# Patient Record
Sex: Female | Born: 1985 | Race: White | Hispanic: No | Marital: Married | Smoking: Never smoker
Health system: Southern US, Community
[De-identification: ages and names within clinical notes are randomized; demographics above are authoritative.]

## PROBLEM LIST (undated history)

## (undated) DIAGNOSIS — K649 Unspecified hemorrhoids: Secondary | ICD-10-CM

## (undated) DIAGNOSIS — B009 Herpesviral infection, unspecified: Secondary | ICD-10-CM

## (undated) DIAGNOSIS — O24419 Gestational diabetes mellitus in pregnancy, unspecified control: Secondary | ICD-10-CM

## (undated) DIAGNOSIS — F909 Attention-deficit hyperactivity disorder, unspecified type: Secondary | ICD-10-CM

## (undated) HISTORY — DX: Attention-deficit hyperactivity disorder, unspecified type: F90.9

---

## 2018-09-11 ENCOUNTER — Encounter: Payer: Self-pay | Admitting: Family Medicine

## 2018-09-27 ENCOUNTER — Encounter: Payer: Self-pay | Admitting: Family Medicine

## 2018-10-31 ENCOUNTER — Encounter: Payer: Self-pay | Admitting: Obstetrics & Gynecology

## 2018-11-11 ENCOUNTER — Ambulatory Visit (INDEPENDENT_AMBULATORY_CARE_PROVIDER_SITE_OTHER): Payer: 59 | Admitting: Advanced Practice Midwife

## 2018-11-11 ENCOUNTER — Other Ambulatory Visit: Payer: Self-pay

## 2018-11-11 ENCOUNTER — Encounter: Payer: Self-pay | Admitting: Advanced Practice Midwife

## 2018-11-11 VITALS — BP 122/82 | HR 98 | Ht 67.0 in | Wt 163.3 lb

## 2018-11-11 DIAGNOSIS — Z124 Encounter for screening for malignant neoplasm of cervix: Secondary | ICD-10-CM

## 2018-11-11 DIAGNOSIS — O219 Vomiting of pregnancy, unspecified: Secondary | ICD-10-CM

## 2018-11-11 DIAGNOSIS — Z113 Encounter for screening for infections with a predominantly sexual mode of transmission: Secondary | ICD-10-CM

## 2018-11-11 DIAGNOSIS — N898 Other specified noninflammatory disorders of vagina: Secondary | ICD-10-CM

## 2018-11-11 DIAGNOSIS — K5900 Constipation, unspecified: Secondary | ICD-10-CM

## 2018-11-11 DIAGNOSIS — Z3A19 19 weeks gestation of pregnancy: Secondary | ICD-10-CM

## 2018-11-11 DIAGNOSIS — Z3401 Encounter for supervision of normal first pregnancy, first trimester: Secondary | ICD-10-CM

## 2018-11-11 DIAGNOSIS — O99611 Diseases of the digestive system complicating pregnancy, first trimester: Secondary | ICD-10-CM

## 2018-11-11 MED ORDER — DOCUSATE SODIUM 100 MG PO CAPS: 100.0000 mg | ORAL_CAPSULE | Freq: Two times a day (BID) | ORAL | 2 refills | Status: DC | PRN

## 2018-11-11 MED ORDER — POLYETHYLENE GLYCOL 3350 17 GM/SCOOP PO POWD: 17.0000 g | Freq: Every day | ORAL | 0 refills | Status: DC

## 2018-11-11 MED ORDER — METOCLOPRAMIDE HCL 10 MG PO TABS: 10.0000 mg | ORAL_TABLET | Freq: Four times a day (QID) | ORAL | 2 refills | Status: DC | PRN

## 2018-11-11 NOTE — Patient Instructions (Signed)

## 2018-11-11 NOTE — Progress Notes (Signed)
Pt is here for initial OB visit. LMP 08/21/18. EDD 05/28/19

## 2018-11-11 NOTE — Progress Notes (Signed)
Subjective:   Azucena Fallennna Nevills is a 33 y.o. G1P0 at 3071w5d by LMP being seen today for her first obstetrical visit.  Her obstetrical history is significant for none and has Encounter for supervision of normal first pregnancy in first trimester on their problem list.. Patient does intend to breast feed. Pregnancy history fully reviewed.  Patient reports nausea.  HISTORY: OB History  Gravida Para Term Preterm AB Living  1 0 0 0 0 0  SAB TAB Ectopic Multiple Live Births  0 0 0 0 0    # Outcome Date GA Lbr Len/2nd Weight Sex Delivery Anes PTL Lv  1 Current            Past Medical History:  Diagnosis Date  . ADHD    History reviewed. No pertinent surgical history. Family History  Problem Relation Age of Onset  . Diabetes Mother   . Hypertension Mother   . Hypertension Father   . COPD Maternal Grandmother   . Diabetes Maternal Grandfather   . COPD Maternal Grandfather    Social History   Tobacco Use  . Smoking status: Never Smoker  . Smokeless tobacco: Never Used  Substance Use Topics  . Alcohol use: Not Currently  . Drug use: Yes   Not on File Current Outpatient Medications on File Prior to Visit  Medication Sig Dispense Refill  . Prenatal Vit-Fe Fumarate-FA (PRENATAL VITAMINS PO) Take by mouth.     No current facility-administered medications on file prior to visit.      Exam   Vitals:   11/11/18 1322 11/11/18 1323  BP: 122/82   Pulse: 98   Weight: 74.1 kg   Height:  5\' 7"  (1.702 m)   Fetal Heart Rate (bpm): 165  Uterus:     Pelvic Exam: Perineum: no hemorrhoids, normal perineum   Vulva: normal external genitalia, no lesions   Vagina:  normal mucosa, normal discharge   Cervix: no lesions and normal, pap smear done.    Adnexa: normal adnexa and no mass, fullness, tenderness   Bony Pelvis: average  System: General: well-developed, well-nourished female in no acute distress   Breast:  normal appearance, no masses or tenderness   Skin: normal  coloration and turgor, no rashes   Neurologic: oriented, normal, negative, normal mood   Extremities: normal strength, tone, and muscle mass, ROM of all joints is normal   HEENT PERRLA, extraocular movement intact and sclera clear, anicteric   Mouth/Teeth mucous membranes moist, pharynx normal without lesions and dental hygiene good   Neck supple and no masses   Cardiovascular: regular rate and rhythm   Respiratory:  no respiratory distress, normal breath sounds   Abdomen: soft, non-tender; bowel sounds normal; no masses,  no organomegaly     Assessment:   Pregnancy: G1P0 Patient Active Problem List   Diagnosis Date Noted  . Encounter for supervision of normal first pregnancy in first trimester 11/11/2018     Plan:  1. Encounter for supervision of normal first pregnancy in first trimester --Anticipatory guidance about next visits/weeks of pregnancy given. --Reviewed safety, visitor policy, reassurance about COVID-19 for pregnancy at this time. Discussed possible changes to visits, including televisits, that may occur due to COVID-19.  The office remains open if pt needs to be seen and [facility] is open 24 hours/day for OB emergencies.   - Obstetric Panel, Including HIV - Culture, OB Urine - Genetic Screening - Cervicovaginal ancillary only( [place]) - Cytology - PAP( [place]) - Babyscripts  Schedule Optimization  2. Nausea and vomiting during pregnancy prior to [redacted] weeks gestation --Discussed dietary changes for nausea.  Pt to spot treat with Reglan since n/v are intermittent, not daily. - metoCLOPramide (REGLAN) 10 MG tablet; Take 1 tablet (10 mg total) by mouth every 6 (six) hours as needed for nausea.  Dispense: 30 tablet; Refill: 2  3. Constipation during pregnancy in first trimester --Pt with hx constipation prior to pregnancy, is eating fiber, drinking more fluid but still having symptoms. - docusate sodium (COLACE) 100 MG capsule; Take 1 capsule (100 mg total) by mouth 2  (two) times daily as needed.  Dispense: 30 capsule; Refill: 2 - polyethylene glycol powder (GLYCOLAX/MIRALAX) 17 GM/SCOOP powder; Take 17 g by mouth daily.  Dispense: 255 g; Refill: 0   Initial labs drawn. Continue prenatal vitamins. Discussed and offered genetic screening options, including Quad screen/AFP, NIPS testing, and option to decline testing. Benefits/risks/alternatives reviewed. Pt aware that anatomy US is form of genetic screening with lower accuracy in detecting trisomies than blood work.  Pt chooses genetic screening today. NIPS: ordered. Ultrasound discussed; fetal anatomic survey: ordered. Problem list reviewed and updated. The nature of [facility] with multiple MDs and other Advanced Practice Providers was explained to patient; also emphasized that residents, students are part of our team. Routine obstetric precautions reviewed. No follow-ups on file.   Sharen Counter, CNM 11/11/18 1:51 PM

## 2018-11-12 LAB — CERVICOVAGINAL ANCILLARY ONLY
Bacterial vaginitis: NEGATIVE
Candida vaginitis: NEGATIVE
Chlamydia: NEGATIVE
Neisseria Gonorrhea: NEGATIVE
Trichomonas: NEGATIVE

## 2018-11-12 LAB — OBSTETRIC PANEL, INCLUDING HIV
Antibody Screen: NEGATIVE
Basophils Absolute: 0 10*3/uL (ref 0.0–0.2)
Basos: 0 %
EOS (ABSOLUTE): 0.1 10*3/uL (ref 0.0–0.4)
Eos: 1 %
HIV Screen 4th Generation wRfx: NONREACTIVE
Hematocrit: 41.1 % (ref 34.0–46.6)
Hemoglobin: 14.2 g/dL (ref 11.1–15.9)
Hepatitis B Surface Ag: NEGATIVE
Immature Grans (Abs): 0 10*3/uL (ref 0.0–0.1)
Immature Granulocytes: 0 %
Lymphocytes Absolute: 1.7 10*3/uL (ref 0.7–3.1)
Lymphs: 17 %
MCH: 32.2 pg (ref 26.6–33.0)
MCHC: 34.5 g/dL (ref 31.5–35.7)
MCV: 93 fL (ref 79–97)
Monocytes Absolute: 0.6 10*3/uL (ref 0.1–0.9)
Monocytes: 6 %
Neutrophils Absolute: 7.5 10*3/uL — ABNORMAL HIGH (ref 1.4–7.0)
Neutrophils: 76 %
Platelets: 297 10*3/uL (ref 150–450)
RBC: 4.41 x10E6/uL (ref 3.77–5.28)
RDW: 13 % (ref 11.7–15.4)
RPR Ser Ql: NONREACTIVE
Rh Factor: POSITIVE
Rubella Antibodies, IGG: 2.88 index (ref >0.99)
WBC: 9.9 10*3/uL (ref 3.4–10.8)

## 2018-11-13 LAB — CULTURE, OB URINE

## 2018-11-13 LAB — CYTOLOGY - PAP
Diagnosis: NEGATIVE
HPV: NOT DETECTED

## 2018-11-13 LAB — URINE CULTURE, OB REFLEX: Organism ID, Bacteria: NO GROWTH

## 2018-11-18 ENCOUNTER — Encounter: Payer: Self-pay | Admitting: Advanced Practice Midwife

## 2018-11-19 ENCOUNTER — Encounter: Payer: Self-pay | Admitting: Advanced Practice Midwife

## 2018-11-20 ENCOUNTER — Encounter: Payer: Self-pay | Admitting: Advanced Practice Midwife

## 2018-11-21 ENCOUNTER — Other Ambulatory Visit: Payer: Self-pay

## 2018-11-21 ENCOUNTER — Other Ambulatory Visit: Payer: 59

## 2018-11-21 DIAGNOSIS — Z3401 Encounter for supervision of normal first pregnancy, first trimester: Secondary | ICD-10-CM

## 2018-11-22 LAB — HEMOGLOBIN A1C
Est. average glucose Bld gHb Est-mCnc: 97 mg/dL
Hgb A1c MFr Bld: 5 % (ref 4.8–5.6)

## 2019-01-01 ENCOUNTER — Ambulatory Visit (HOSPITAL_COMMUNITY)
Admission: RE | Admit: 2019-01-01 | Discharge: 2019-01-01 | Disposition: A | Discharge status: Home / Self Care | Payer: 59 | Source: Ambulatory Visit | Attending: Obstetrics and Gynecology | Admitting: Obstetrics and Gynecology

## 2019-01-01 ENCOUNTER — Other Ambulatory Visit: Payer: Self-pay

## 2019-01-01 DIAGNOSIS — Z363 Encounter for antenatal screening for malformations: Secondary | ICD-10-CM | POA: Diagnosis not present

## 2019-01-01 DIAGNOSIS — Z3401 Encounter for supervision of normal first pregnancy, first trimester: Secondary | ICD-10-CM | POA: Insufficient documentation

## 2019-01-01 DIAGNOSIS — Z3A19 19 weeks gestation of pregnancy: Secondary | ICD-10-CM

## 2019-01-01 IMAGING — US US MFM OB COMP + 14 WK
1 series · 13 of 28 positions shown · non-contrast
Comparison: none

[Series 1: us mfm ob comp + 14 wk · 109 acquisitions, 13 frames shown]
[im 5/109]
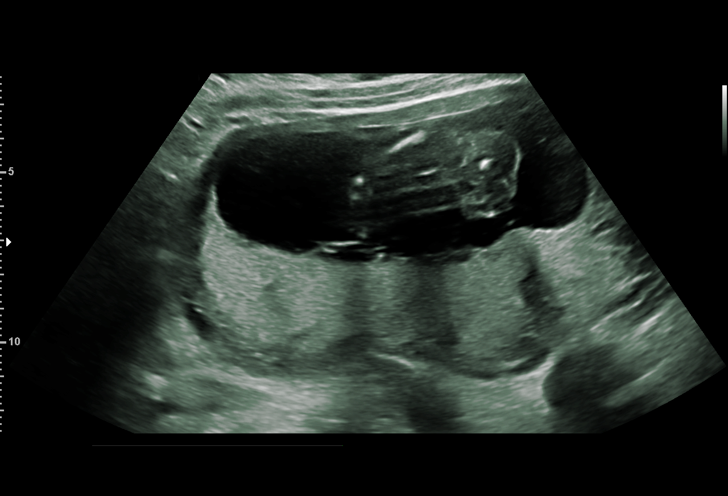
[im 13/109]
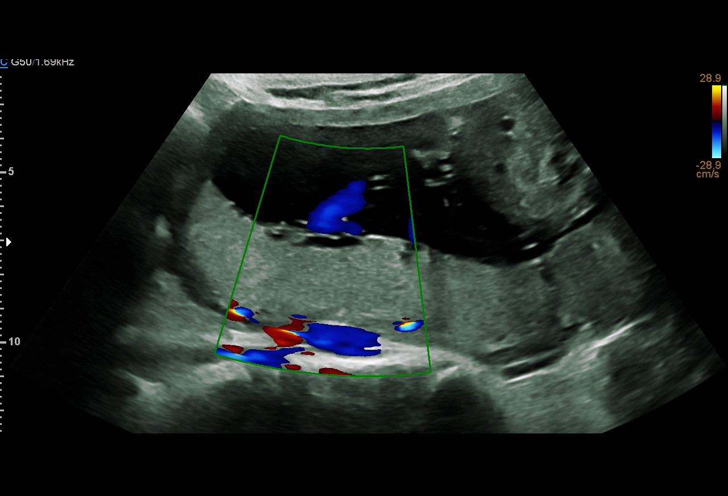
[im 21/109]
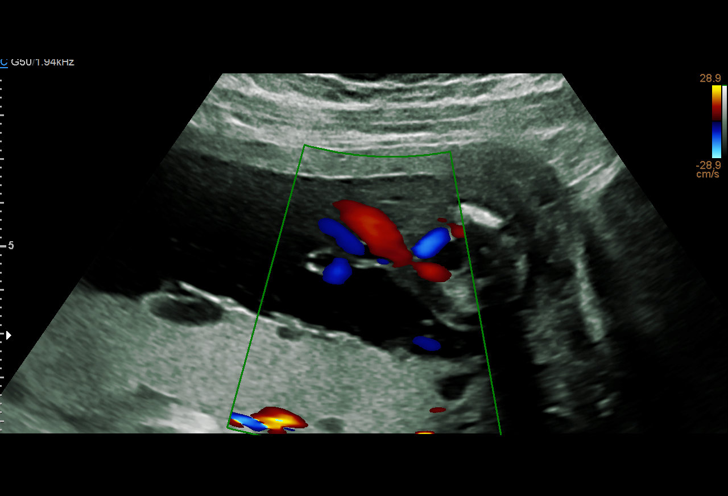
[im 29/109]
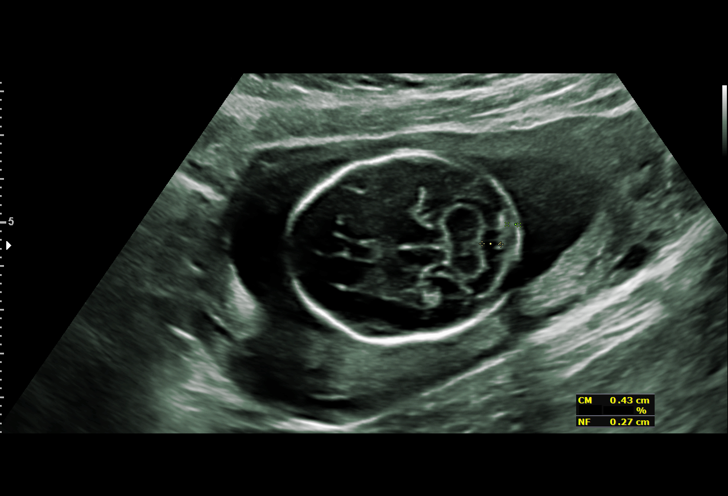
[im 37/109]
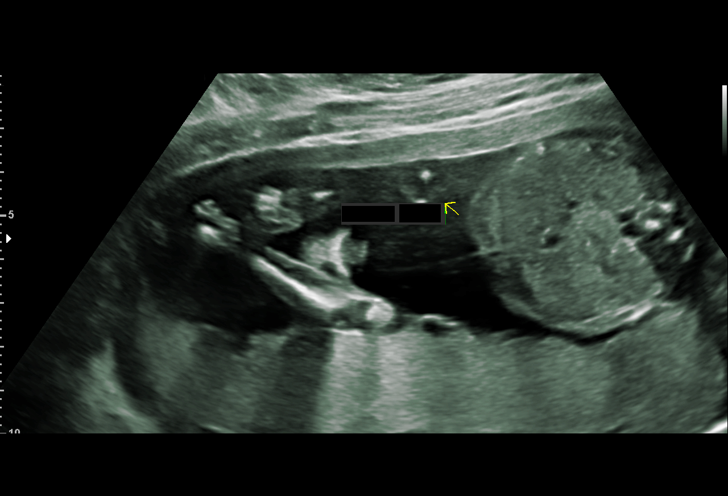
[im 45/109]
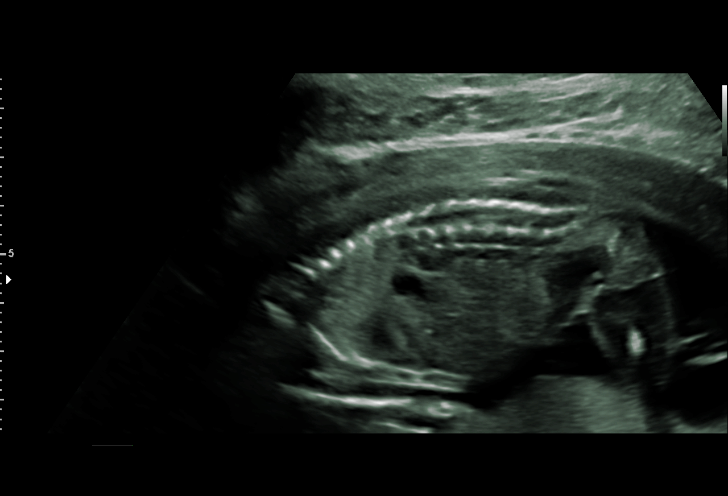
[im 57/109]
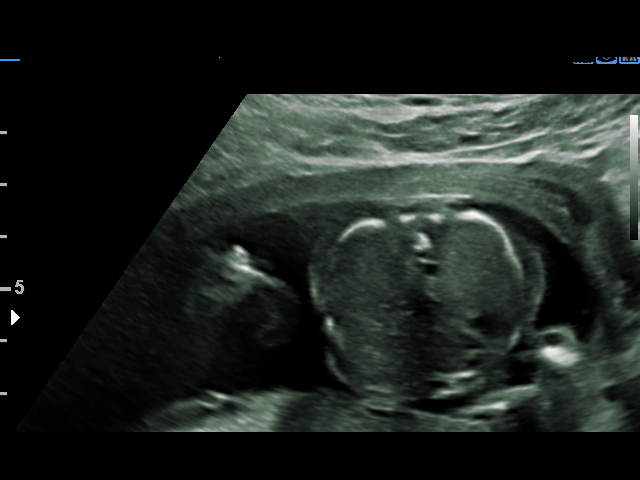
[im 65/109]
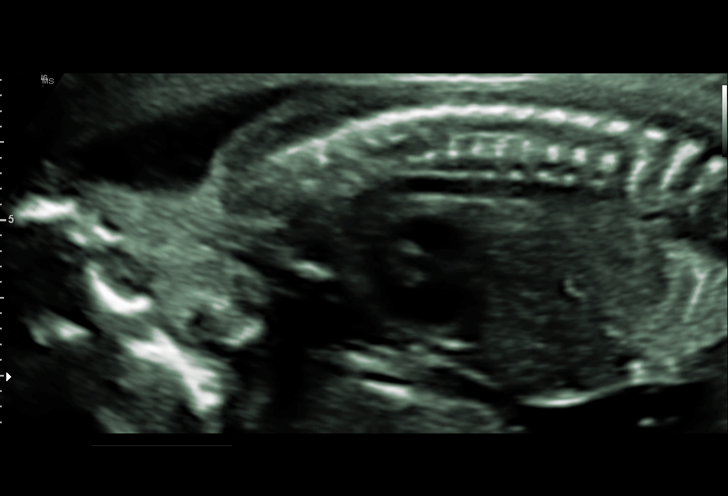
[im 73/109]
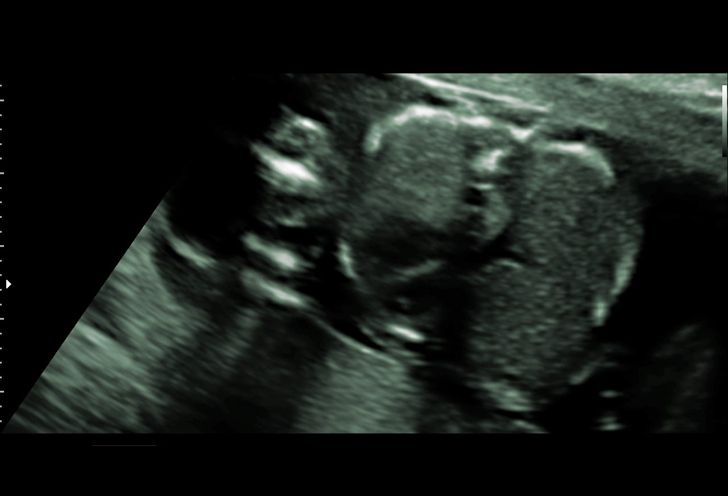
[im 81/109]
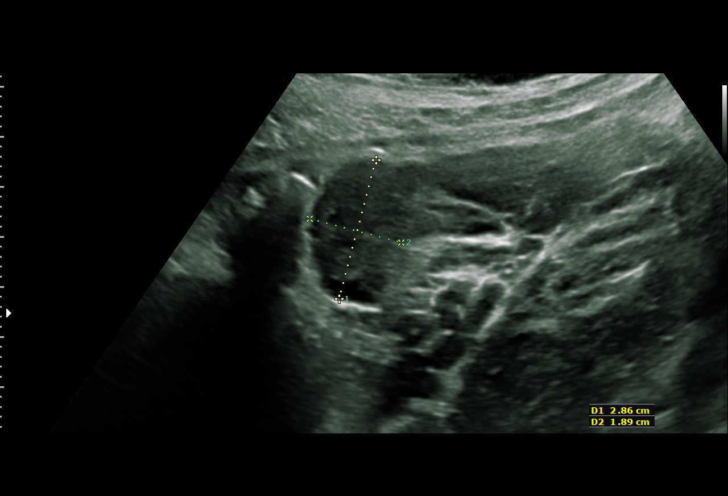
[im 89/109]
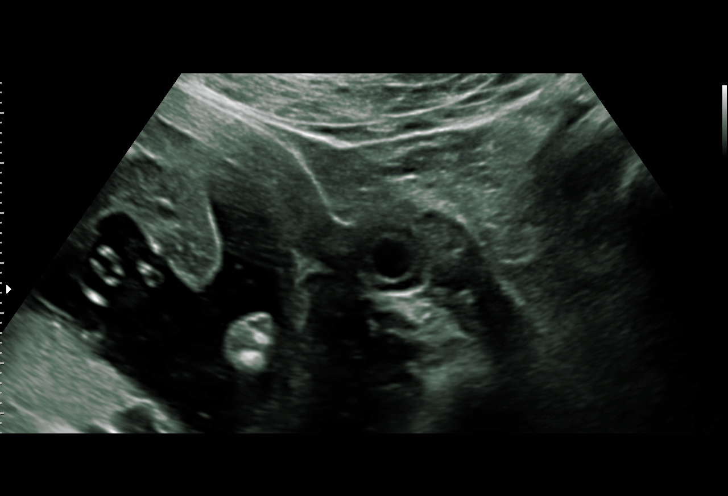
[im 97/109]
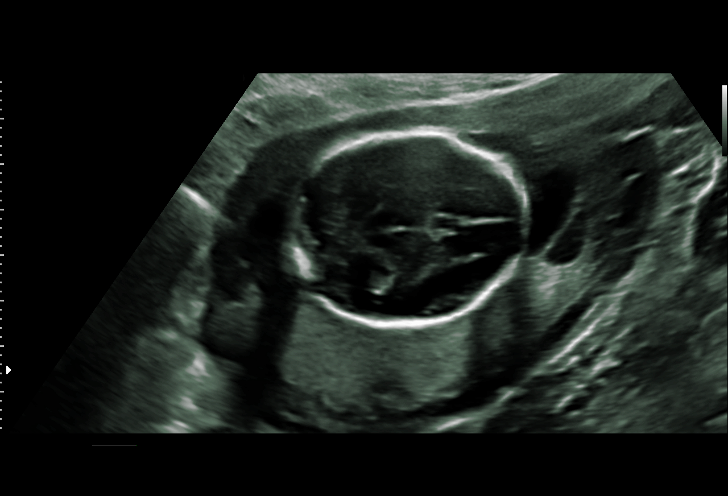
[im 105/109]
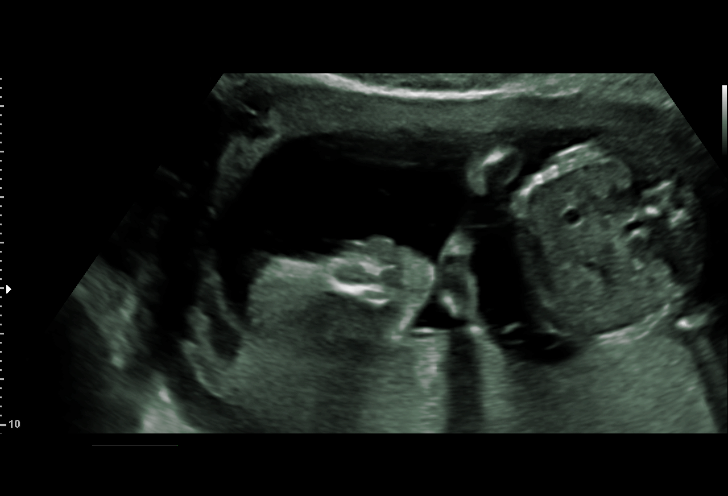

[13 of 28 positions shown; findings below may reference images not displayed]

----------------------------------------------------------------------

 ----------------------------------------------------------------------
Indications

  19 weeks gestation of pregnancy
  Encounter for antenatal screening for
  malformations
  Low risk NIPS
 ----------------------------------------------------------------------
Fetal Evaluation

 Num Of Fetuses:          1
 Fetal Heart Rate(bpm):   146
 Cardiac Activity:        Observed
 Presentation:            Transverse, head to maternal right
 Placenta:                Posterior
 P. Cord Insertion:       Visualized

 Amniotic Fluid
 AFI FV:      Within normal limits

                             Largest Pocket(cm)

Biometry

 BPD:      42.9  mm     G. Age:  19w 0d         50  %    CI:        71.69   %    70 - 86
                                                         FL/HC:       18.0  %    16.1 -
 HC:      161.3  mm     G. Age:  19w 0d         40  %    HC/AC:       1.17       1.09 -
 AC:      137.5  mm     G. Age:  19w 1d         52  %    FL/BPD:      67.6  %
 FL:         29  mm     G. Age:  18w 6d         40  %    FL/AC:       21.1  %    20 - 24
 HUM:      28.8  mm     G. Age:  19w 2d         59  %
 CER:      18.8  mm     G. Age:  18w 3d         32  %
 NFT:       2.7  mm
 LV:        5.9  mm
 CM:        4.3  mm

 Est. FW:     271   gm   0 lb 10 oz      45  %
OB History

 Gravidity:    1
Gestational Age

 Clinical EDD:  19w 0d                                        EDD:   05/28/19
 U/S Today:     19w 0d                                        EDD:   05/28/19
 Best:          19w 0d     Det. By:  Clinical EDD             EDD:   05/28/19
Anatomy

 Cranium:               Appears normal         LVOT:                   Not well visualized
 Cavum:                 Appears normal         Aortic Arch:            Appears normal
 Ventricles:            Appears normal         Ductal Arch:            Appears normal
 Choroid Plexus:        Appears normal         Diaphragm:              Appears normal
 Cerebellum:            Appears normal         Stomach:                Appears normal, left
                                                                       sided
 Posterior Fossa:       Appears normal         Abdomen:                Appears normal
 Nuchal Fold:           Appears normal         Abdominal Wall:         Appears nml (cord
                                                                       insert, abd wall)
 Face:                  Appears normal         Cord Vessels:           Appears normal (3
                        (orbits and profile)                           vessel cord)
 Lips:                  Appears normal         Kidneys:                Appear normal
 Palate:                Appears normal         Bladder:                Appears normal
 Thoracic:              Appears normal         Spine:                  Appears normal
 Heart:                 Not well visualized    Upper Extremities:      Appears normal
 RVOT:                  Not well visualized    Lower Extremities:      Appears normal

 Other:  Nasal bone visualized. Female gender Cerebellar vermis visualized.
         Heels and 5th digit visualized.
Cervix Uterus Adnexa

 Cervix
 Length:            4.5  cm.
 Normal appearance by transabdominal scan.

 Left Ovary
 Within normal limits. Small corpus luteum noted.

 Right Ovary
 Within normal limits.
Impression

 Normal interval growth.  No ultrasonic evidence of structural
 fetal anomalies.
 Suboptimal views of the fetal anatomy as obtained secondary
 to fetal position.
Recommendations

 Follow up anatomy in 4-6 weeks.

## 2019-01-03 ENCOUNTER — Other Ambulatory Visit (HOSPITAL_COMMUNITY): Payer: Self-pay | Admitting: *Deleted

## 2019-01-03 DIAGNOSIS — Z362 Encounter for other antenatal screening follow-up: Secondary | ICD-10-CM

## 2019-01-06 ENCOUNTER — Encounter: Payer: 59 | Admitting: Advanced Practice Midwife

## 2019-01-09 ENCOUNTER — Ambulatory Visit (INDEPENDENT_AMBULATORY_CARE_PROVIDER_SITE_OTHER): Payer: 59 | Admitting: Obstetrics and Gynecology

## 2019-01-09 ENCOUNTER — Other Ambulatory Visit: Payer: Self-pay

## 2019-01-09 ENCOUNTER — Encounter: Payer: Self-pay | Admitting: Obstetrics and Gynecology

## 2019-01-09 VITALS — BP 122/74 | HR 94 | Wt 176.0 lb

## 2019-01-09 DIAGNOSIS — O26892 Other specified pregnancy related conditions, second trimester: Secondary | ICD-10-CM

## 2019-01-09 DIAGNOSIS — Z3401 Encounter for supervision of normal first pregnancy, first trimester: Secondary | ICD-10-CM

## 2019-01-09 DIAGNOSIS — K5900 Constipation, unspecified: Secondary | ICD-10-CM

## 2019-01-09 DIAGNOSIS — Z3A20 20 weeks gestation of pregnancy: Secondary | ICD-10-CM

## 2019-01-09 NOTE — Patient Instructions (Addendum)
High-Fiber Diet Fiber, also called dietary fiber, is a type of carbohydrate that is found in fruits, vegetables, whole grains, and beans. A high-fiber diet can have many health benefits. Your health care provider may recommend a high-fiber diet to help:  Prevent constipation. Fiber can make your bowel movements more regular.  Lower your cholesterol.  Relieve the following conditions: ? Swelling of veins in the anus (hemorrhoids). ? Swelling and irritation (inflammation) of specific areas of the digestive tract (uncomplicated diverticulosis). ? A problem of the large intestine (colon) that sometimes causes pain and diarrhea (irritable bowel syndrome, IBS).  Prevent overeating as part of a weight-loss plan.  Prevent heart disease, type 2 diabetes, and certain cancers. What is my plan? The recommended daily fiber intake in grams (g) includes:  38 g for men age 50 or younger.  30 g for men over age 50.  25 g for women age 50 or younger.  21 g for women over age 50. You can get the recommended daily intake of dietary fiber by:  Eating a variety of fruits, vegetables, grains, and beans.  Taking a fiber supplement, if it is not possible to get enough fiber through your diet. What do I need to know about a high-fiber diet?  It is better to get fiber through food sources rather than from fiber supplements. There is not a lot of research about how effective supplements are.  Always check the fiber content on the nutrition facts label of any prepackaged food. Look for foods that contain 5 g of fiber or more per serving.  Talk with a diet and nutrition specialist (dietitian) if you have questions about specific foods that are recommended or not recommended for your medical condition, especially if those foods are not listed below.  Gradually increase how much fiber you consume. If you increase your intake of dietary fiber too quickly, you may have bloating, cramping, or gas.  Drink plenty  of water. Water helps you to digest fiber. What are tips for following this plan?  Eat a wide variety of high-fiber foods.  Make sure that half of the grains that you eat each day are whole grains.  Eat breads and cereals that are made with whole-grain flour instead of refined flour or white flour.  Eat brown rice, bulgur wheat, or millet instead of white rice.  Start the day with a breakfast that is high in fiber, such as a cereal that contains 5 g of fiber or more per serving.  Use beans in place of meat in soups, salads, and pasta dishes.  Eat high-fiber snacks, such as berries, raw vegetables, nuts, and popcorn.  Choose whole fruits and vegetables instead of processed forms like juice or sauce. What foods can I eat?  Fruits Berries. Pears. Apples. Oranges. Avocado. Prunes and raisins. Dried figs. Vegetables Sweet potatoes. Spinach. Kale. Artichokes. Cabbage. Broccoli. Cauliflower. Green peas. Carrots. Squash. Grains Whole-grain breads. Multigrain cereal. Oats and oatmeal. Brown rice. Barley. Bulgur wheat. Millet. Quinoa. Bran muffins. Popcorn. Rye wafer crackers. Meats and other proteins Navy, kidney, and pinto beans. Soybeans. Split peas. Lentils. Nuts and seeds. Dairy Fiber-fortified yogurt. Beverages Fiber-fortified soy milk. Fiber-fortified orange juice. Other foods Fiber bars. The items listed above may not be a complete list of recommended foods and beverages. Contact a dietitian for more options. What foods are not recommended? Fruits Fruit juice. Cooked, strained fruit. Vegetables Fried potatoes. Canned vegetables. Well-cooked vegetables. Grains White bread. Pasta made with refined flour. White rice. Meats and other   proteins Fatty cuts of meat. Fried chicken or fried fish. Dairy Milk. Yogurt. Cream cheese. Sour cream. Fats and oils Butters. Beverages Soft drinks. Other foods Cakes and pastries. The items listed above may not be a complete list of foods  and beverages to avoid. Contact a dietitian for more information. Summary  Fiber is a type of carbohydrate. It is found in fruits, vegetables, whole grains, and beans.  There are many health benefits of eating a high-fiber diet, such as preventing constipation, lowering blood cholesterol, helping with weight loss, and reducing your risk of heart disease, diabetes, and certain cancers.  Gradually increase your intake of fiber. Increasing too fast can result in cramping, bloating, and gas. Drink plenty of water while you increase your fiber.  The best sources of fiber include whole fruits and vegetables, whole grains, nuts, seeds, and beans. This information is not intended to replace advice given to you by your health care provider. Make sure you discuss any questions you have with your health care provider. Document Released: 06/26/2005 Document Revised: 04/30/2017 Document Reviewed: 04/30/2017 Elsevier Interactive Patient Education  2019 [employer]    Use the following websites (and others) to help learn more about your contraception options and find the method that is right for you!  - The Centers for Disease Control [employer](CDC) website: TransferLive.sehttps://www.cdc.gov/reproductivehealth/contraception/index.htm  - Planned Parenthood website: https://www.plannedparenthood.org/learn/birth-control  - Bedsider.org: https://www.bedsider.org/methods  Go to bedsider.org for more information!     Contraception Choices Contraception, also called birth control, refers to methods or devices that prevent pregnancy. Hormonal methods Contraceptive implant A contraceptive implant is a thin, plastic tube that contains a hormone. It is inserted into the upper part of the arm. It can remain in place for up to 3 years. Progestin-only injections Progestin-only injections are injections of progestin, a synthetic form of the hormone progesterone. They are given every 3 months by a health care provider. Birth  control pills Birth control pills are pills that contain hormones that prevent pregnancy. They must be taken once a day, preferably at the same time each day. Birth control patch The birth control patch contains hormones that prevent pregnancy. It is placed on the skin and must be changed once a week for three weeks and removed on the fourth week. A prescription is needed to use this method of contraception. Vaginal ring A vaginal ring contains hormones that prevent pregnancy. It is placed in the vagina for three weeks and removed on the fourth week. After that, the process is repeated with a new ring. A prescription is needed to use this method of contraception. Emergency contraceptive Emergency contraceptives prevent pregnancy after unprotected sex. They come in pill form and can be taken up to 5 days after sex. They work best the sooner they are taken after having sex. Most emergency contraceptives are available without a prescription. This method should not be used as your only form of birth control. Barrier methods Female condom A female condom is a thin sheath that is worn over the penis during sex. Condoms keep sperm from going inside a woman's body. They can be used with a spermicide to increase their effectiveness. They should be disposed after a single use. Female condom A female condom is a soft, loose-fitting sheath that is put into the vagina before sex. The condom keeps sperm from going inside a woman's body. They should be disposed after a single use.  Intrauterine contraception Intrauterine device (IUD) An IUD is a T-shaped device that is put in a  woman's uterus. There are two types:  Hormone IUD.This type contains progestin, a synthetic form of the hormone progesterone. This type can stay in place for 3-5 years.  Copper IUD.This type is wrapped in copper wire. It can stay in place for 10 years.  Permanent methods of contraception Female tubal ligation In this method, a woman's  fallopian tubes are sealed, tied, or blocked during surgery to prevent eggs from traveling to the uterus.  Female sterilization This is a procedure to tie off the tubes that carry sperm (vasectomy). After the procedure, the man can still ejaculate fluid (semen).  Summary  Contraception, also called birth control, means methods or devices that prevent pregnancy.  Hormonal methods of contraception include implants, injections, pills, patches, vaginal rings, and emergency contraceptives.  Barrier methods of contraception can include female condoms, female condoms, diaphragms, cervical caps, sponges, and spermicides.  There are two types of IUDs (intrauterine devices). An IUD can be put in a woman's uterus to prevent pregnancy for 3-5 years.  Permanent sterilization can be done through a procedure for males, females, or both. This information is not intended to replace advice given to you by your health care provider. Make sure you discuss any questions you have with your health care provider. Document Released: 06/26/2005 Document Revised: 07/29/2016 Document Reviewed: 07/29/2016 Elsevier Interactive Patient Education  2018 [employer].

## 2019-01-09 NOTE — Progress Notes (Signed)
   PRENATAL VISIT NOTE  Subjective:  Jackie [name] is a 33 y.o. G1P0 at [redacted]w[redacted]d being seen today for ongoing prenatal care.  She is currently monitored for the following issues for this low-risk pregnancy and has Encounter for supervision of normal first pregnancy in first trimester on their problem list.  Patient reports no complaints.  Contractions: Not present. Vag. Bleeding: None.  Movement: Absent. Denies leaking of fluid.   The following portions of the patient's history were reviewed and updated as appropriate: allergies, current medications, past family history, past medical history, past social history, past surgical history and problem list.   Objective:   Vitals:   01/09/19 0922  BP: 122/74  Pulse: 94  Weight: 176 lb (79.8 kg)    Fetal Status: Fetal Heart Rate (bpm): 150   Movement: Absent     General:  Alert, oriented and cooperative. Patient is in no acute distress.  Skin: Skin is warm and dry. No rash noted.   Cardiovascular: Normal heart rate noted  Respiratory: Normal respiratory effort, no problems with respiration noted  Abdomen: Soft, gravid, appropriate for gestational age.  Pain/Pressure: Absent     Pelvic: Cervical exam deferred        Extremities: Normal range of motion.     Mental Status: Normal mood and affect. Normal behavior. Normal judgment and thought content.   Assessment and Plan:  Pregnancy: G1P0 at [redacted]w[redacted]d  1. Encounter for supervision of normal first pregnancy in first trimester - AFP today - Doing well, no issues - Reviewed appropriate weight gain - Reviewed [facility] [employer] [facility] practice structure, multiple providers, fellows, medical students.  - reviewed options for contraception, gave info  2. Constipation, unspecified constipation type - Reviewed increasing water intake, high fiber diet and use of colace BID, as well as metamucil/benefiber prn   Preterm labor symptoms and general obstetric precautions including but not limited  to vaginal bleeding, contractions, leaking of fluid and fetal movement were reviewed in detail with the patient. Please refer to After Visit Summary for other counseling recommendations.   Return in about 8 weeks (around 03/06/2019) for OB visit, in person, 3rd trim labs, 2 hr GTT.  Future Appointments  Date Time Provider [place]  01/20/2019  2:45 PM [place] [facility]  03/06/2019  8:30 AM [facility] None  03/06/2019  8:45 AM [name] Leiter, MD [facility] None    [name] Leiter, MD

## 2019-01-12 LAB — AFP, SERUM, OPEN SPINA BIFIDA
AFP MoM: 1.19
AFP Value: 60.5 ng/mL
Gest. Age on Collection Date: 20.1 weeks
Maternal Age At EDD: 31.2 yr
OSBR Risk 1 IN: 6704
Test Results:: NEGATIVE
Weight: 176 [lb_av]

## 2019-01-20 ENCOUNTER — Ambulatory Visit (HOSPITAL_COMMUNITY)
Admission: RE | Admit: 2019-01-20 | Discharge: 2019-01-20 | Disposition: A | Discharge status: Home / Self Care | Payer: 59 | Source: Ambulatory Visit | Attending: Obstetrics and Gynecology | Admitting: Obstetrics and Gynecology

## 2019-01-20 ENCOUNTER — Other Ambulatory Visit: Payer: Self-pay

## 2019-01-20 DIAGNOSIS — Z3A21 21 weeks gestation of pregnancy: Secondary | ICD-10-CM

## 2019-01-20 DIAGNOSIS — Z362 Encounter for other antenatal screening follow-up: Secondary | ICD-10-CM | POA: Diagnosis present

## 2019-01-20 IMAGING — US US MFM OB FOLLOW UP
1 series · 14 of 28 positions shown · non-contrast
Comparison: none

[Series 1: us mfm ob follow up · 46 acquisitions, 14 frames shown]
[im 2/46]
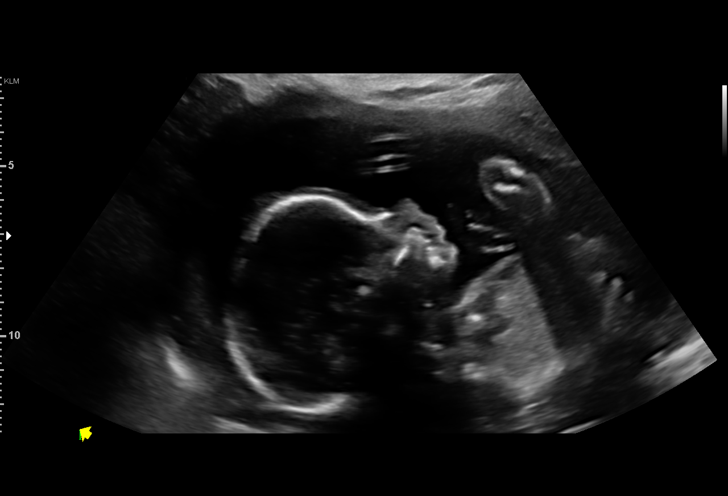
[im 6/46]
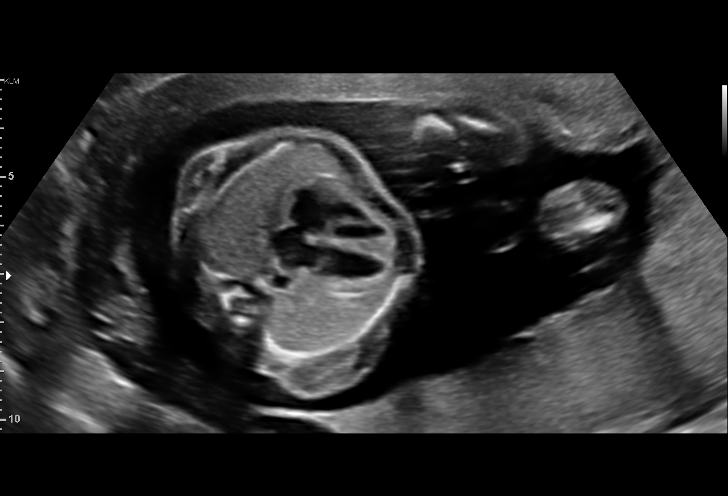
[im 9/46]
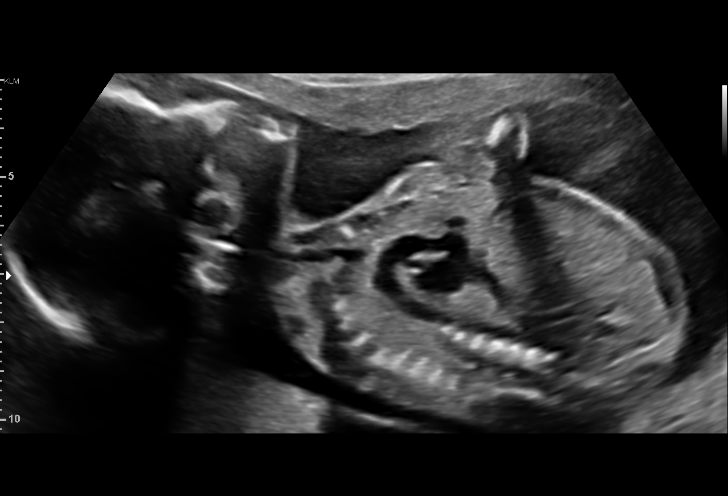
[im 12/46]
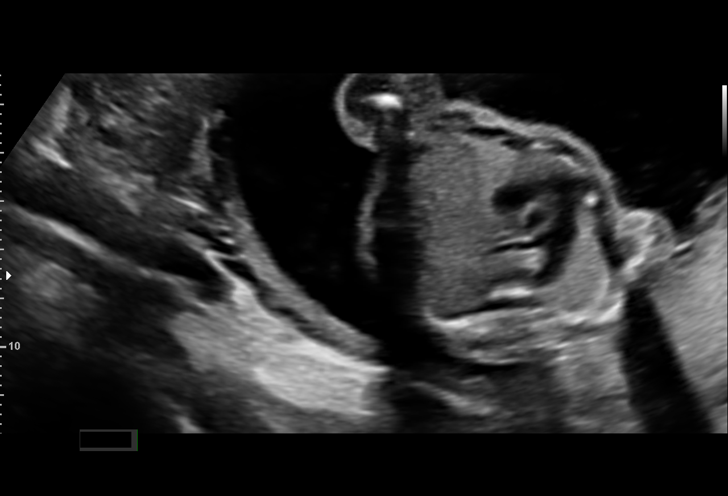
[im 16/46]
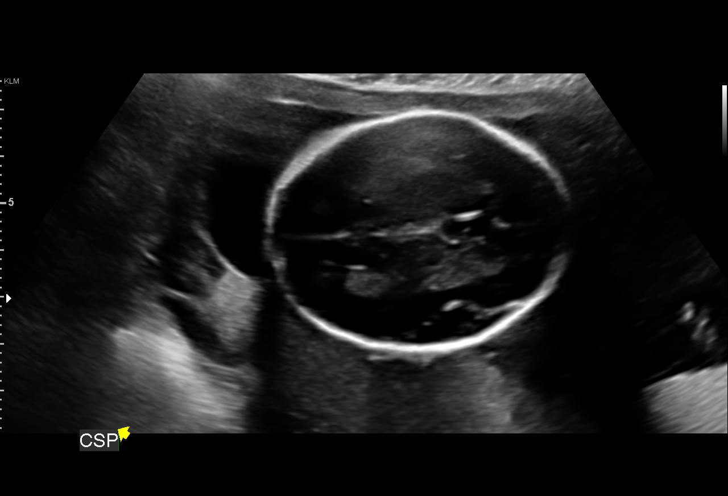
[im 19/46]
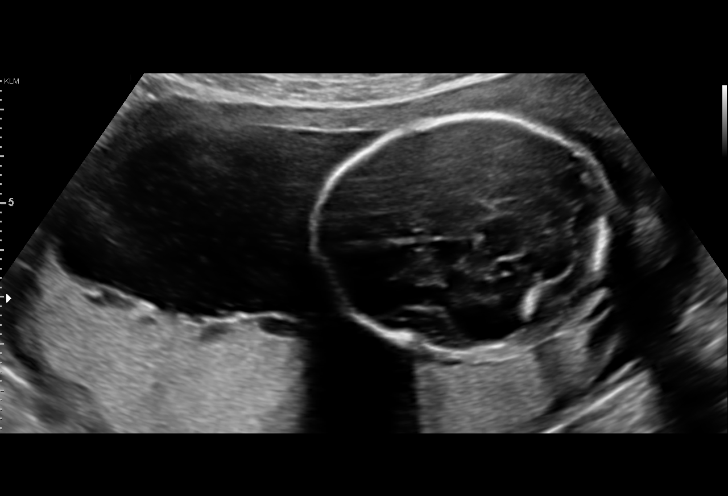
[im 22/46]
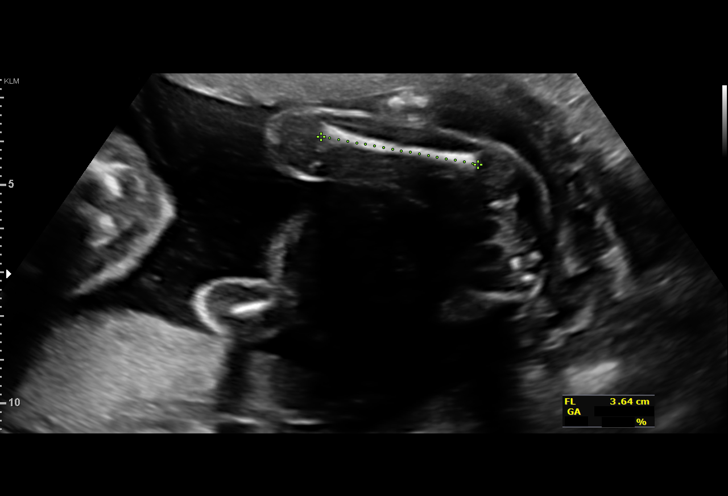
[im 26/46]
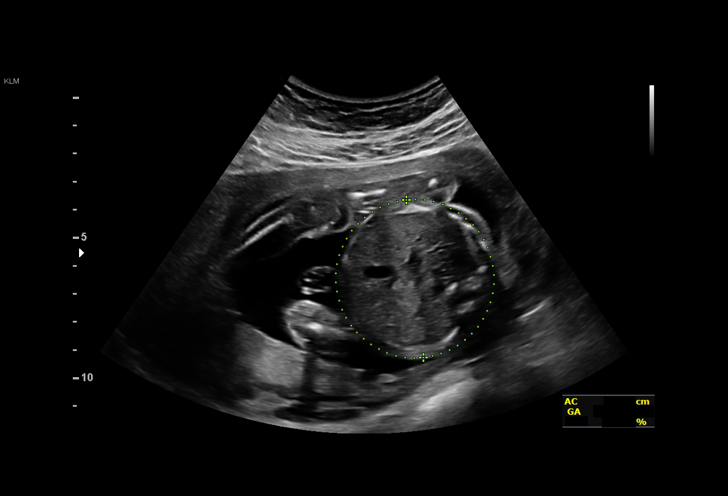
[im 29/46]
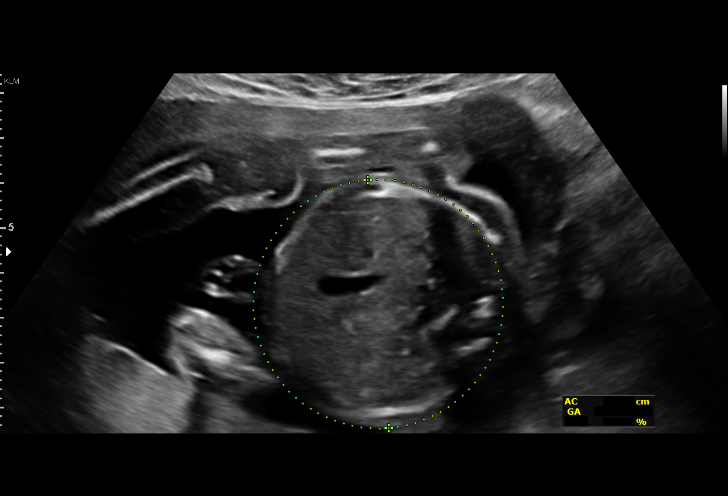
[im 32/46]
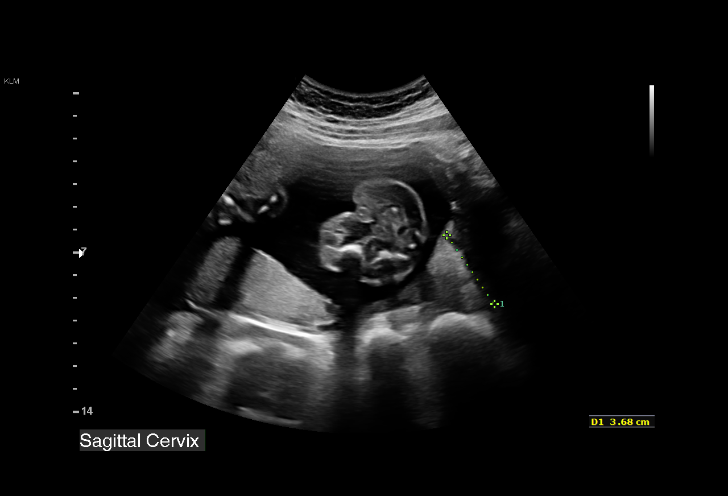
[im 36/46]
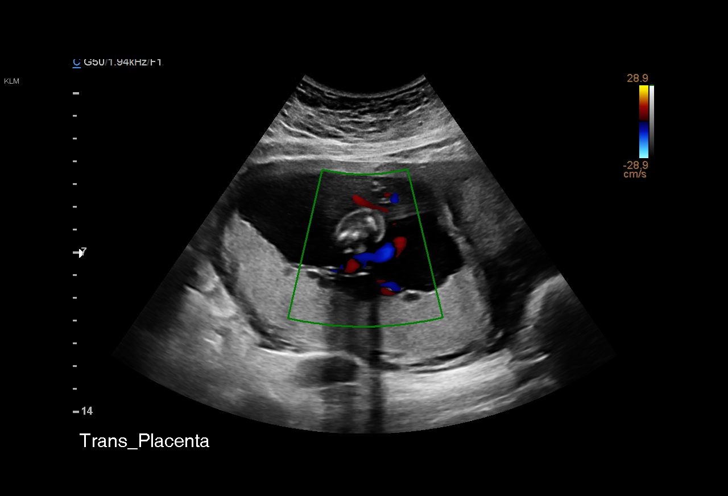
[im 39/46]
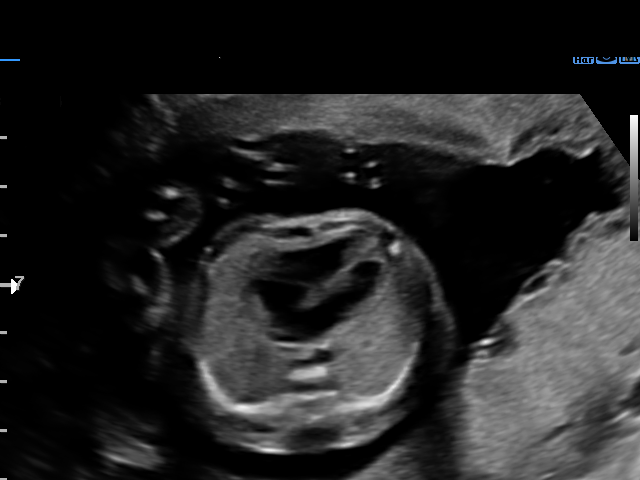
[im 42/46]
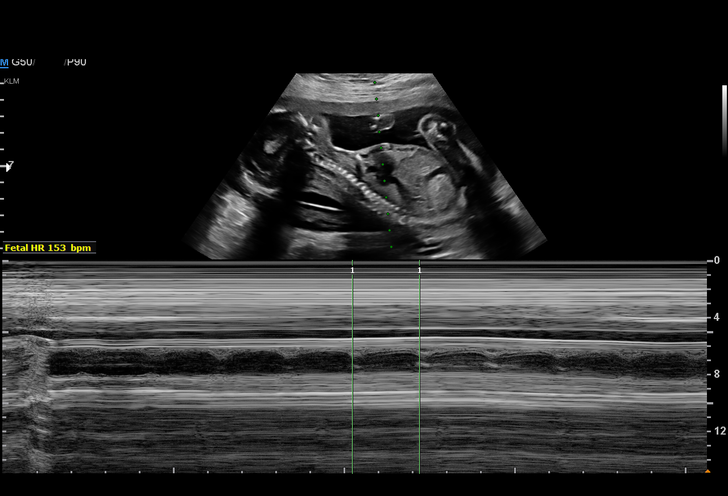
[im 46/46]
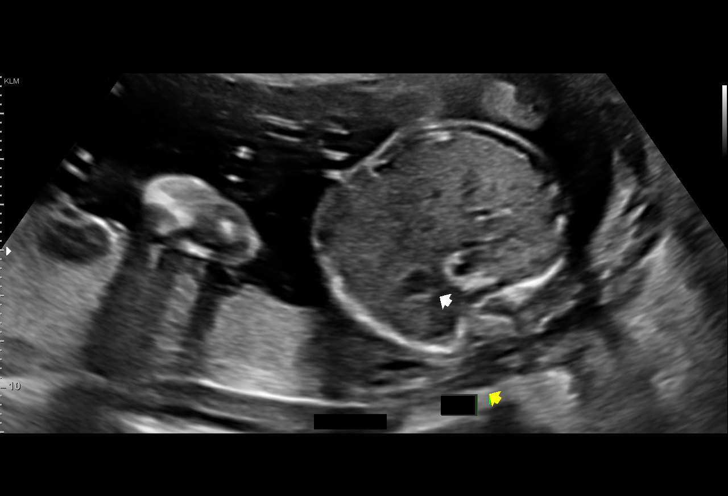

[14 of 28 positions shown; findings below may reference images not displayed]

ABNER
 ----------------------------------------------------------------------

 ----------------------------------------------------------------------
Indications

  Antenatal follow-up for nonvisualized fetal    [XM]
  anatomy
  21 weeks gestation of pregnancy
  Low risk NIPS
 ----------------------------------------------------------------------
Fetal Evaluation

 Num Of Fetuses:         1
 Fetal Heart Rate(bpm):  153
 Cardiac Activity:       Observed
 Presentation:           Transverse, head to maternal right
 Placenta:               Posterior
 P. Cord Insertion:      Previously Visualized

 Amniotic Fluid
 AFI FV:      Within normal limits

                             Largest Pocket(cm)

Biometry

 BPD:        51  mm     G. Age:  21w 3d         37  %    CI:        74.19   %    70 - 86
                                                         FL/HC:      19.1   %    18.4 -
 HC:       188   mm     G. Age:  21w 1d         16  %    HC/AC:      1.09        1.06 -
 AC:      171.9  mm     G. Age:  22w 1d         57  %    FL/BPD:     70.6   %    71 - 87
 FL:         36  mm     G. Age:  21w 3d         30  %    FL/AC:      20.9   %    20 - 24

 Est. FW:     446  gm           1 lb     45  %
OB History

 Gravidity:    1
Gestational Age

 Clinical EDD:  21w 5d                                        EDD:   [DATE]
 U/S Today:     21w 4d                                        EDD:   [DATE]
 Best:          21w 5d     Det. By:  Clinical EDD             EDD:   [DATE]
Anatomy

 Cranium:               Appears normal         LVOT:                   Appears normal
 Cavum:                 Previously seen        Aortic Arch:            Appears normal
 Ventricles:            Previously seen        Ductal Arch:            Appears normal
 Choroid Plexus:        Previously seen        Diaphragm:              Appears normal
 Cerebellum:            Previously seen        Stomach:                Appears normal, left
                                                                       sided
 Posterior Fossa:       Previously seen        Abdomen:                Appears normal
 Nuchal Fold:           Previously seen        Abdominal Wall:         Previously seen
 Face:                  Orbits and profile     Cord Vessels:           Previously seen
                        previously seen
 Lips:                  Previously seen        Kidneys:                Appear normal
 Palate:                Appears normal         Bladder:                Appears normal
 Thoracic:              Appears normal         Spine:                  Previously seen
 Heart:                 Appears normal         Upper Extremities:      Previously seen
                        (4CH, axis, and
                        situs)
 RVOT:                  Appears normal         Lower Extremities:      Previously seen

 Other:  Nasal bone previously visualized. Female gender Cerebellar vermis
         visualized. Heels and 5th digit previously visualized.
Cervix Uterus Adnexa

 Cervix
 Length:            3.7  cm.
 Normal appearance by transabdominal scan.
Impression

 Patient returned for completion of fetal anatomy. Fetal growth
 is appropriate for gestational age. Amniotic fluid is normal
 and good fetal activity is seen. Fetal anatomical survey was
 completed and appears normal.
Recommendations

 Follow-up scans as clinically indicated.
                 ABNER

## 2019-01-30 ENCOUNTER — Ambulatory Visit (HOSPITAL_COMMUNITY): Payer: 59

## 2019-03-06 ENCOUNTER — Other Ambulatory Visit: Payer: Self-pay

## 2019-03-06 ENCOUNTER — Other Ambulatory Visit: Payer: 59

## 2019-03-06 ENCOUNTER — Ambulatory Visit (INDEPENDENT_AMBULATORY_CARE_PROVIDER_SITE_OTHER): Payer: 59 | Admitting: Obstetrics and Gynecology

## 2019-03-06 VITALS — BP 115/75 | HR 91 | Wt 191.4 lb

## 2019-03-06 DIAGNOSIS — Z3401 Encounter for supervision of normal first pregnancy, first trimester: Secondary | ICD-10-CM

## 2019-03-06 DIAGNOSIS — Z362 Encounter for other antenatal screening follow-up: Secondary | ICD-10-CM

## 2019-03-06 DIAGNOSIS — Z3A28 28 weeks gestation of pregnancy: Secondary | ICD-10-CM

## 2019-03-06 DIAGNOSIS — Z3482 Encounter for supervision of other normal pregnancy, second trimester: Secondary | ICD-10-CM

## 2019-03-06 DIAGNOSIS — Z23 Encounter for immunization: Secondary | ICD-10-CM

## 2019-03-06 MED ORDER — BREAST PUMP MISC: 0 refills | Status: AC

## 2019-03-06 NOTE — Progress Notes (Addendum)
   PRENATAL VISIT NOTE  Subjective:  Jackie [name] is a 33 y.o. G1P0 at [redacted]w[redacted]d being seen today for ongoing prenatal care.  She is currently monitored for the following issues for this low-risk pregnancy and has Encounter for supervision of normal first pregnancy in first trimester on their problem list.  Patient reports hemorrhoids.  Contractions: Not present. Vag. Bleeding: None.  Movement: Present. Denies leaking of fluid.   The following portions of the patient's history were reviewed and updated as appropriate: allergies, current medications, past family history, past medical history, past social history, past surgical history and problem list.   Objective:   Vitals:   03/06/19 0858  BP: 115/75  Pulse: 91  Weight: 191 lb 6.4 oz (86.8 kg)   Fetal Status: Fetal Heart Rate (bpm): 150   Movement: Present     General:  Alert, oriented and cooperative. Patient is in no acute distress.  Skin: Skin is warm and dry. No rash noted.   Cardiovascular: Normal heart rate noted  Respiratory: Normal respiratory effort, no problems with respiration noted  Abdomen: Soft, gravid, appropriate for gestational age.  Pain/Pressure: Present     Pelvic: Cervical exam deferred        Extremities: Normal range of motion.  Edema: None  Mental Status: Normal mood and affect. Normal behavior. Normal judgment and thought content.   Pt informed that the ultrasound is considered a limited OB ultrasound and is not intended to be a complete ultrasound exam.  Patient also informed that the ultrasound is not being completed with the intent of assessing for fetal or placental anomalies or any pelvic abnormalities.  Explained that the purpose of today's ultrasound is to assess for  presentation.  Patient acknowledges the purpose of the exam and the limitations of the study.    Bedside US: breech position, normal cardiac activity, fetal movement noted, hiccups seen, grossly normal fluid   Assessment and Plan:   Pregnancy: G1P0 at [redacted]w[redacted]d  1. Encounter for supervision of normal first pregnancy in first trimester - Glucose Tolerance, 2 Hours w/1 Hour - RPR - CBC - HIV antibody (with reflex) - Tdap   Preterm labor symptoms and general obstetric precautions including but not limited to vaginal bleeding, contractions, leaking of fluid and fetal movement were reviewed in detail with the patient. Please refer to After Visit Summary for other counseling recommendations.   Return in about 2 weeks (around 03/20/2019) for OB visit, virtual.  No future appointments.  [name] Leiter, MD

## 2019-03-06 NOTE — Addendum Note (Signed)
Addended by: Courtney Heys on: 03/06/2019 10:07 AM   Modules accepted: Orders

## 2019-03-06 NOTE — Progress Notes (Signed)
Pt reports fetal movement with pressure. 

## 2019-03-07 LAB — CBC
Hematocrit: 37.6 % (ref 34.0–46.6)
Hemoglobin: 12.9 g/dL (ref 11.1–15.9)
MCH: 32.8 pg (ref 26.6–33.0)
MCHC: 34.3 g/dL (ref 31.5–35.7)
MCV: 96 fL (ref 79–97)
Platelets: 266 10*3/uL (ref 150–450)
RBC: 3.93 x10E6/uL (ref 3.77–5.28)
RDW: 11.8 % (ref 11.7–15.4)
WBC: 11.1 10*3/uL — ABNORMAL HIGH (ref 3.4–10.8)

## 2019-03-07 LAB — GLUCOSE TOLERANCE, 2 HOURS W/ 1HR
Glucose, 1 hour: 126 mg/dL (ref 65–179)
Glucose, 2 hour: 101 mg/dL (ref 65–152)
Glucose, Fasting: 92 mg/dL — ABNORMAL HIGH (ref 65–91)

## 2019-03-07 LAB — RPR: RPR Ser Ql: NONREACTIVE

## 2019-03-07 LAB — HIV ANTIBODY (ROUTINE TESTING W REFLEX): HIV Screen 4th Generation wRfx: NONREACTIVE

## 2019-03-08 ENCOUNTER — Encounter: Payer: Self-pay | Admitting: Obstetrics and Gynecology

## 2019-03-08 DIAGNOSIS — O24419 Gestational diabetes mellitus in pregnancy, unspecified control: Secondary | ICD-10-CM | POA: Insufficient documentation

## 2019-03-10 ENCOUNTER — Other Ambulatory Visit: Payer: Self-pay

## 2019-03-10 ENCOUNTER — Other Ambulatory Visit: Payer: Self-pay | Admitting: *Deleted

## 2019-03-10 DIAGNOSIS — O24419 Gestational diabetes mellitus in pregnancy, unspecified control: Secondary | ICD-10-CM

## 2019-03-10 MED ORDER — ACCU-CHEK GUIDE VI STRP: ORAL_STRIP | 12 refills | Status: DC

## 2019-03-10 MED ORDER — ACCU-CHEK FASTCLIX LANCETS MISC: 1.0000 | Freq: Four times a day (QID) | 12 refills | Status: DC

## 2019-03-10 MED ORDER — ACCU-CHEK GUIDE W/DEVICE KIT: 1.0000 | PACK | Freq: Four times a day (QID) | 0 refills | Status: DC

## 2019-03-10 NOTE — Progress Notes (Signed)
refe

## 2019-03-12 ENCOUNTER — Telehealth: Payer: Self-pay

## 2019-03-12 NOTE — Telephone Encounter (Signed)
Patient is concern her blood sugar level may have been affected by something she ate the day before. She would like to be tested for her fasting again. Thoughts?

## 2019-03-13 ENCOUNTER — Telehealth: Payer: Self-pay

## 2019-03-13 NOTE — Telephone Encounter (Signed)
-----   Message from [place], [place] sent at 03/13/2019  2:12 PM EDT -----  ----- Message ----- From: Lavonia Drafts, MD Sent: 03/13/2019  12:50 PM EDT To: Demetrice Jeanella Anton, CMA  Re pts request to be rescreened. This is  a fasting test so it is not based on what she recently ate.   It is safer for her and her baby to just begin checking her glc.   Clh-S

## 2019-03-13 NOTE — Telephone Encounter (Signed)
  Left VM Message with Doctor's recommendations and to pick up supplies at Pharmacy and expect a call from [facility].

## 2019-03-20 ENCOUNTER — Telehealth (INDEPENDENT_AMBULATORY_CARE_PROVIDER_SITE_OTHER): Payer: 59 | Admitting: Obstetrics

## 2019-03-20 ENCOUNTER — Encounter: Payer: Self-pay | Admitting: Obstetrics

## 2019-03-20 ENCOUNTER — Other Ambulatory Visit: Payer: Self-pay

## 2019-03-20 ENCOUNTER — Telehealth: Payer: 59 | Admitting: Obstetrics

## 2019-03-20 ENCOUNTER — Ambulatory Visit (HOSPITAL_COMMUNITY)
Admission: RE | Admit: 2019-03-20 | Discharge: 2019-03-20 | Disposition: A | Discharge status: Home / Self Care | Payer: 59 | Source: Ambulatory Visit | Attending: Obstetrics and Gynecology | Admitting: Obstetrics and Gynecology

## 2019-03-20 DIAGNOSIS — O0990 Supervision of high risk pregnancy, unspecified, unspecified trimester: Secondary | ICD-10-CM

## 2019-03-20 DIAGNOSIS — Z3A30 30 weeks gestation of pregnancy: Secondary | ICD-10-CM

## 2019-03-20 DIAGNOSIS — O99613 Diseases of the digestive system complicating pregnancy, third trimester: Secondary | ICD-10-CM | POA: Diagnosis not present

## 2019-03-20 DIAGNOSIS — O26843 Uterine size-date discrepancy, third trimester: Secondary | ICD-10-CM

## 2019-03-20 DIAGNOSIS — O24410 Gestational diabetes mellitus in pregnancy, diet controlled: Secondary | ICD-10-CM | POA: Diagnosis not present

## 2019-03-20 DIAGNOSIS — K5900 Constipation, unspecified: Secondary | ICD-10-CM

## 2019-03-20 DIAGNOSIS — O99619 Diseases of the digestive system complicating pregnancy, unspecified trimester: Secondary | ICD-10-CM

## 2019-03-20 DIAGNOSIS — J301 Allergic rhinitis due to pollen: Secondary | ICD-10-CM | POA: Diagnosis not present

## 2019-03-20 DIAGNOSIS — Z362 Encounter for other antenatal screening follow-up: Secondary | ICD-10-CM | POA: Diagnosis not present

## 2019-03-20 DIAGNOSIS — Z131 Encounter for screening for diabetes mellitus: Secondary | ICD-10-CM

## 2019-03-20 DIAGNOSIS — O0993 Supervision of high risk pregnancy, unspecified, third trimester: Secondary | ICD-10-CM | POA: Diagnosis not present

## 2019-03-20 DIAGNOSIS — Z3401 Encounter for supervision of normal first pregnancy, first trimester: Secondary | ICD-10-CM | POA: Insufficient documentation

## 2019-03-20 IMAGING — US US MFM OB FOLLOW-UP
1 series · 14 of 28 positions shown · non-contrast
Comparison: none

[Series 1: us mfm ob follow-up · 40 acquisitions, 14 frames shown]
[im 2/40]
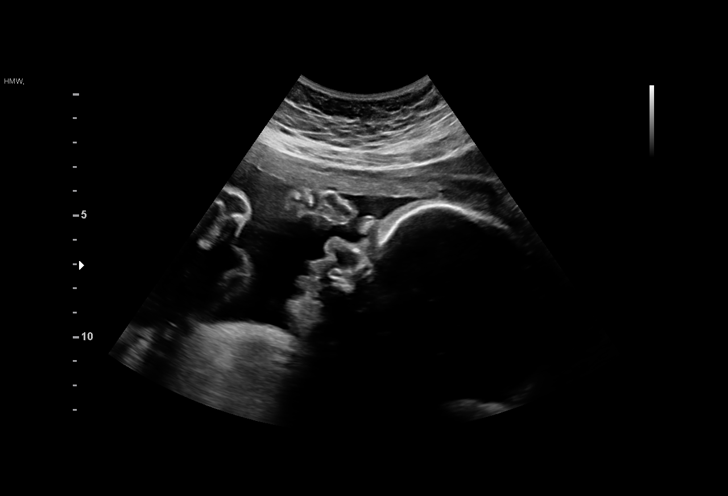
[im 5/40]
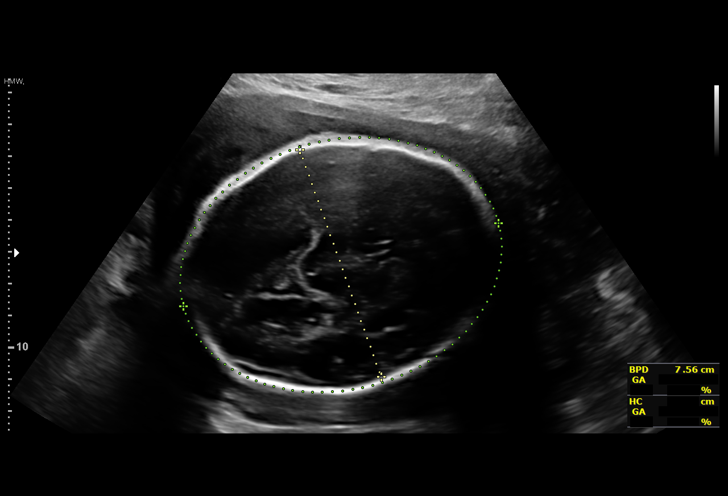
[im 8/40]
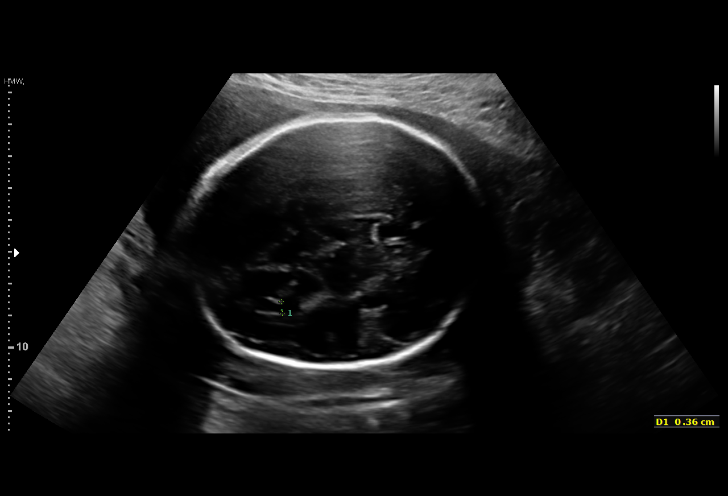
[im 11/40]
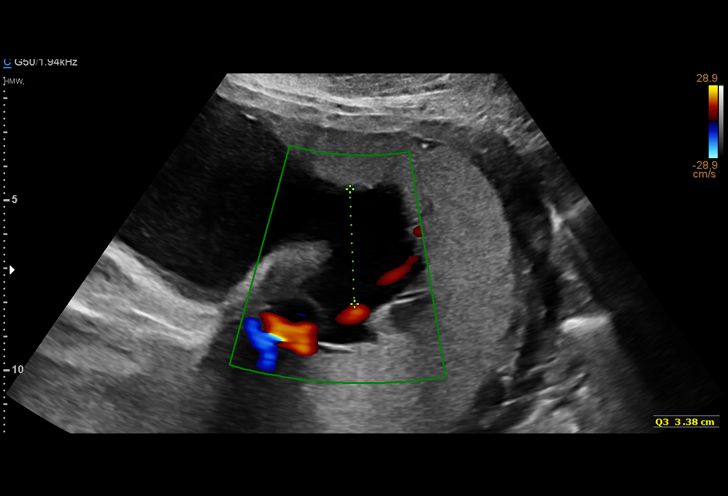
[im 14/40]
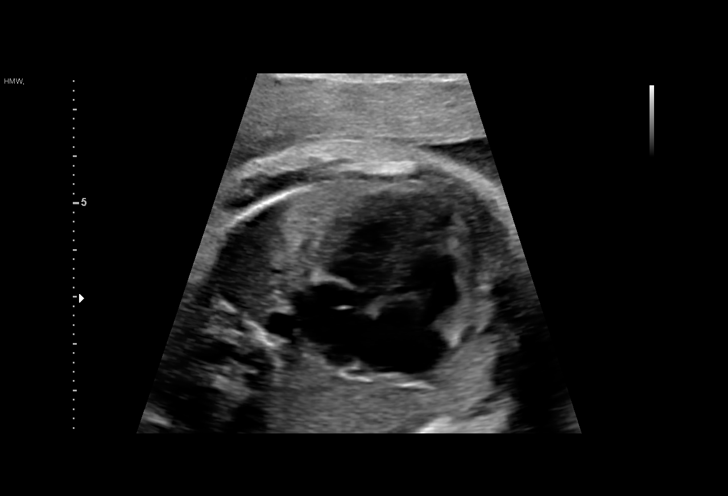
[im 16/40]
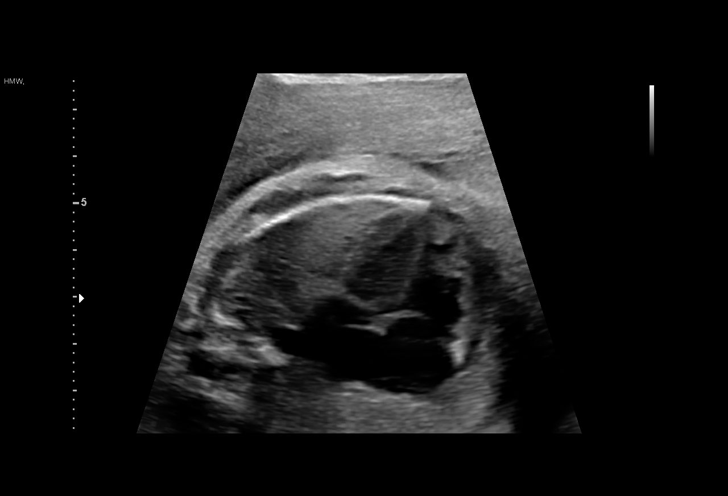
[im 19/40]
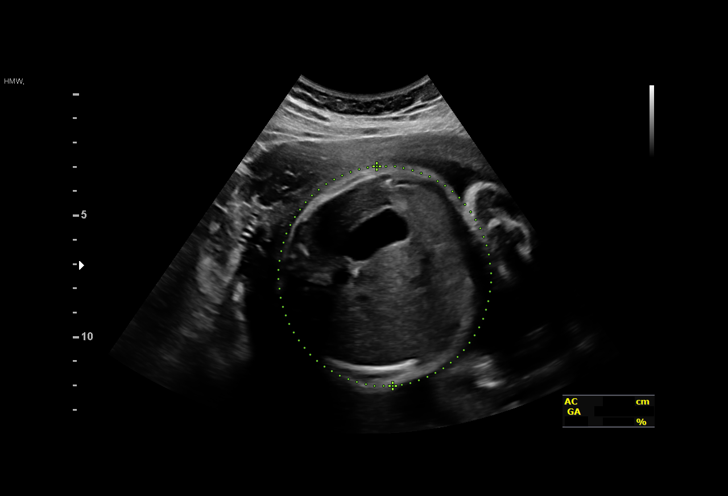
[im 22/40]
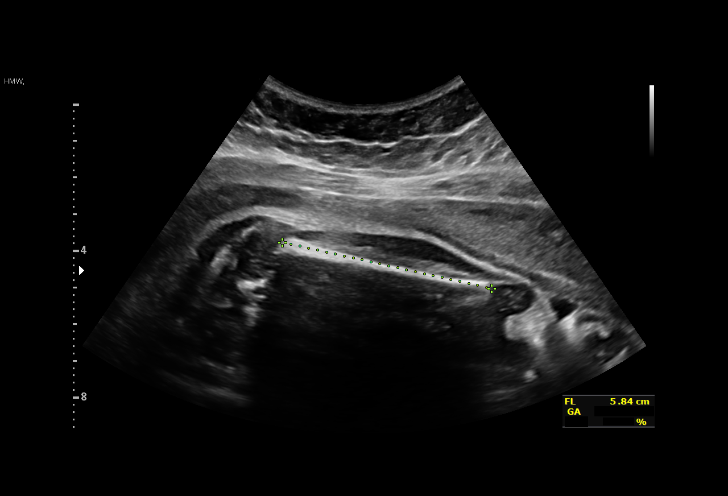
[im 25/40]
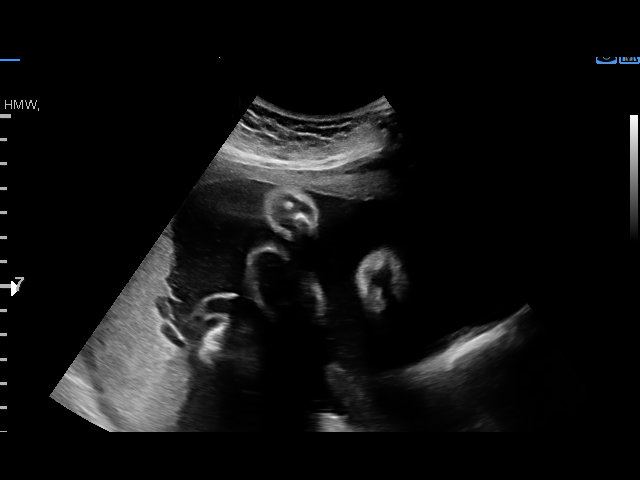
[im 28/40]
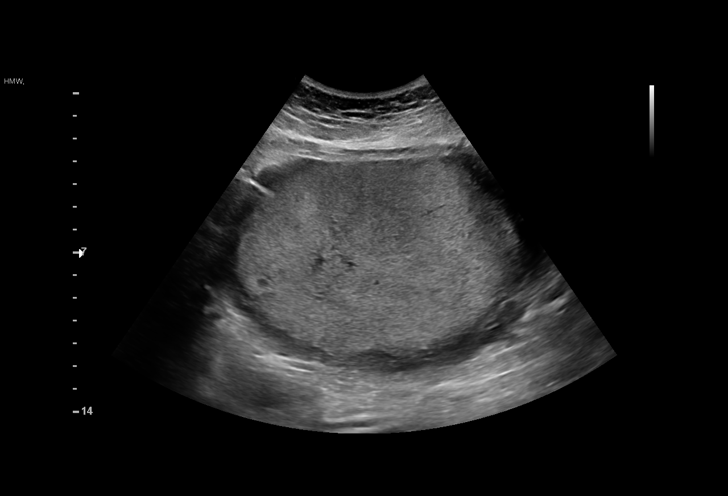
[im 31/40]
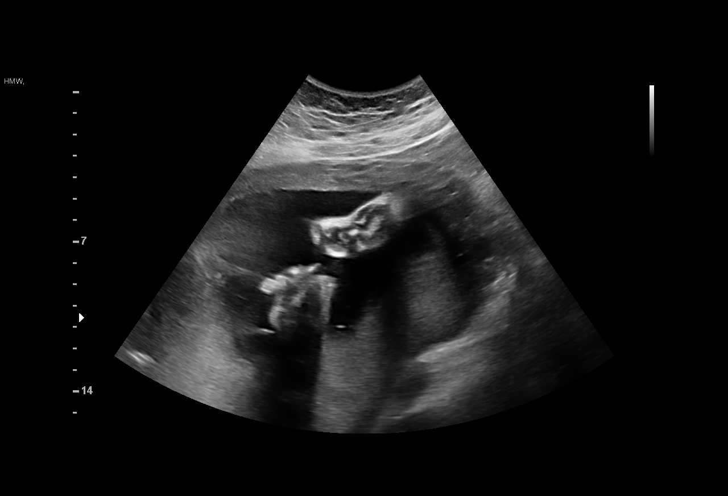
[im 34/40]
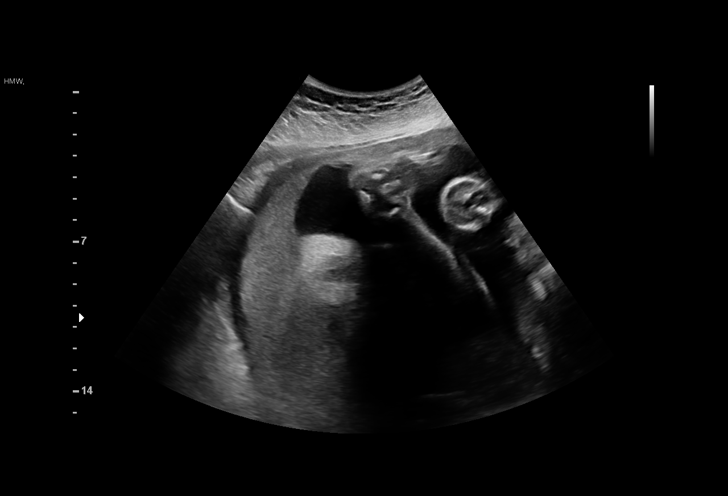
[im 37/40]
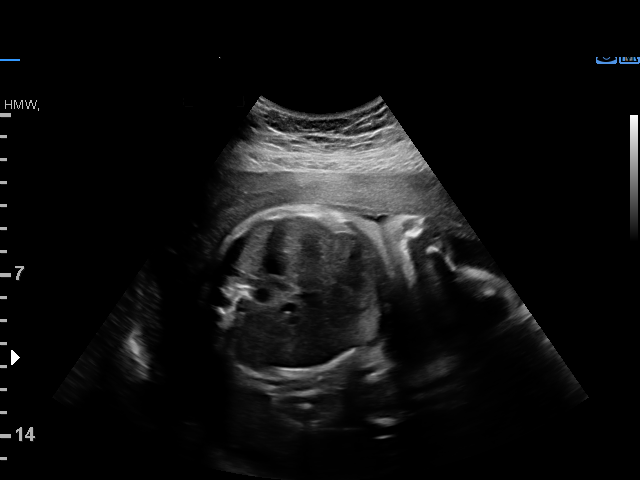
[im 40/40]
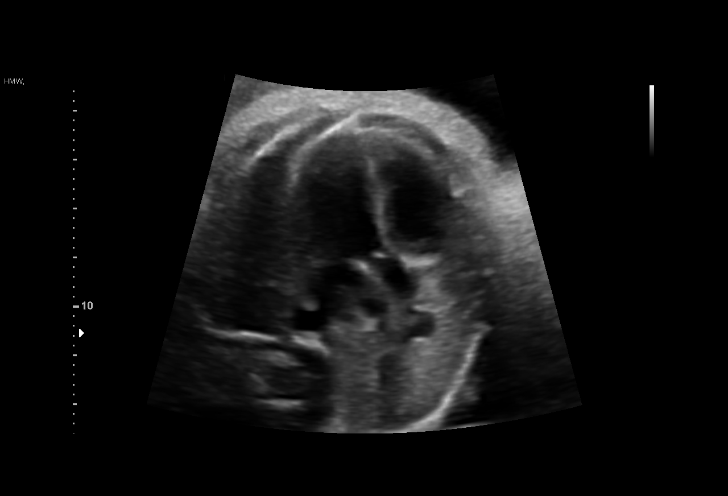

[14 of 28 positions shown; findings below may reference images not displayed]

----------------------------------------------------------------------

 ----------------------------------------------------------------------
Indications

  30 weeks gestation of pregnancy
  Low risk NIPS
  Encounter for other antenatal screening
  follow-up
  Size-Date Discrepancy
 ----------------------------------------------------------------------
Fetal Evaluation

 Num Of Fetuses:          1
 Fetal Heart Rate(bpm):   155
 Cardiac Activity:        Observed
 Presentation:            Cephalic
 Placenta:                Posterior
 P. Cord Insertion:       Visualized, central

 Amniotic Fluid
 AFI FV:      Within normal limits

 AFI Sum(cm)     %Tile       Largest Pocket(cm)
 11.8            28

 RUQ(cm)       RLQ(cm)       LUQ(cm)        LLQ(cm)
 4.23          0
Biometry

 BPD:      76.2  mm     G. Age:  30w 4d         52  %    CI:        73.55   %    70 - 86
                                                         FL/HC:       20.8  %    19.2 -
 HC:      282.3  mm     G. Age:  31w 0d         37  %    HC/AC:       1.01       0.99 -
 AC:      280.7  mm     G. Age:  32w 1d         92  %    FL/BPD:      77.0  %    71 - 87
 FL:       58.7  mm     G. Age:  30w 5d         50  %    FL/AC:       20.9  %    20 - 24
 HUM:      53.5  mm     G. Age:  31w 1d         71  %
 Est. FW:    7287   gm   3 lb 14 oz      81  %
OB History

 Gravidity:    1
Gestational Age

 Clinical EDD:  30w 1d                                        EDD:   05/28/19
 U/S Today:     31w 1d                                        EDD:   05/21/19
 Best:          30w 1d     Det. By:  Clinical EDD             EDD:   05/28/19
Anatomy

 Cranium:               Appears normal         LVOT:                   Appears normal
 Cavum:                 Previously seen        Aortic Arch:            Previously seen
 Ventricles:            Previously seen        Ductal Arch:            Previously seen
 Choroid Plexus:        Previously seen        Diaphragm:              Previously seen
 Cerebellum:            Previously seen        Stomach:                Appears normal, left
                                                                       sided
 Posterior Fossa:       Previously seen        Abdomen:                Previously seen
 Nuchal Fold:           Previously seen        Abdominal Wall:         Previously seen
 Face:                  Orbits and profile     Cord Vessels:           Previously seen
                        previously seen
 Lips:                  Previously seen        Kidneys:                Previously seen
 Palate:                Appears normal         Bladder:                Appears normal
 Thoracic:              Appears normal         Spine:                  Previously seen
 Heart:                 Appears normal         Upper Extremities:      Previously seen
                        (4CH, axis, and
                        situs)
 RVOT:                  Previously seen        Lower Extremities:      Previously seen

 Other:  Nasal bone previously visualized. Female gender Cerebellar vermis
         visualized previously. Heels and 5th digit previously visualized.
Cervix Uterus Adnexa

 Cervix
 Not visualized (advanced GA >41wks)

 Uterus
 No abnormality visualized.

 Left Ovary
 No adnexal mass visualized.

 Right Ovary
 No adnexal mass visualized.

 Cul De Sac
 No free fluid seen.

 Adnexa
 No abnormality visualized.
Impression

 Normal interval growth.
 Anatomy cleared
Recommendations

 Follow up as clinically indicated.

## 2019-03-20 MED ORDER — DOCUSATE SODIUM 100 MG PO CAPS: 100.0000 mg | ORAL_CAPSULE | Freq: Two times a day (BID) | ORAL | 5 refills | Status: AC

## 2019-03-20 MED ORDER — POLYETHYLENE GLYCOL 3350 17 G PO PACK: 17.0000 g | PACK | Freq: Every evening | ORAL | 5 refills | Status: DC | PRN

## 2019-03-20 MED ORDER — LORATADINE 10 MG PO TABS: 10.0000 mg | ORAL_TABLET | Freq: Every day | ORAL | 11 refills | Status: DC

## 2019-03-20 MED ORDER — PRENATAL PLUS 27-1 MG PO TABS: 1.0000 | ORAL_TABLET | Freq: Every day | ORAL | 3 refills | Status: AC

## 2019-03-20 NOTE — Progress Notes (Signed)
   [facility] VIRTUAL VIDEO VISIT ENCOUNTER NOTE  Provider location: [facility] for [facility] at [place]   I connected with Jackie [name] on 03/20/19 at 11:00 AM EDT by OB MyChart Video Encounter at home and verified that I am speaking with the correct person using two identifiers.   I discussed the limitations, risks, security and privacy concerns of performing an evaluation and management service virtually and the availability of in person appointments. I also discussed with the patient that there may be a patient responsible charge related to this service. The patient expressed understanding and agreed to proceed.  Subjective:  Jackie [name] is a 33 y.o. G1P0 at [redacted]w[redacted]d being seen today for ongoing prenatal care.  She is currently monitored for the following issues for this high-risk pregnancy and has Encounter for supervision of normal first pregnancy in first trimester and Gestational diabetes mellitus (GDM) on their problem list.  Patient reports runny-nose and watery-eyes from pollen.  Contractions: Not present. Vag. Bleeding: None.  Movement: Present. Denies any leaking of fluid.   The following portions of the patient's history were reviewed and updated as appropriate: allergies, current medications, past family history, past medical history, past social history, past surgical history and problem list.   Objective:  There were no vitals filed for this visit.  Fetal Status:     Movement: Present     General:  Alert, oriented and cooperative. Patient is in no acute distress.  Respiratory: Normal respiratory effort, no problems with respiration noted  Mental Status: Normal mood and affect. Normal behavior. Normal judgment and thought content.  Rest of physical exam deferred due to type of encounter  Imaging: No results found.  Assessment and Plan:  Pregnancy: G1P0 at [redacted]w[redacted]d 1. Supervision of high risk pregnancy, antepartum Rx: - prenatal vitamin w/FE,  FA (PRENATAL 1 + 1) 27-1 MG TABS tablet; Take 1 tablet by mouth daily before breakfast.  Dispense: 90 tablet; Refill: 3  2. Diet controlled gestational diabetes mellitus (GDM) in third trimester - patient requested repeat of fasting blood glucose  3. Screening for diabetes mellitus Rx: - Basic Metabolic Panel (BMET); Future  4. Constipation during pregnancy, antepartum Rx: - docusate sodium (COLACE) 100 MG capsule; Take 1 capsule (100 mg total) by mouth 2 (two) times daily.  Dispense: 60 capsule; Refill: 5 - Miralax Rx 1 packet nightly at bedtime  5. Seasonal allergic rhinitis due to pollen Rx: - loratadine (CLARITIN) 10 MG tablet; Take 1 tablet (10 mg total) by mouth daily.  Dispense: 30 tablet; Refill: 11  Preterm labor symptoms and general obstetric precautions including but not limited to vaginal bleeding, contractions, leaking of fluid and fetal movement were reviewed in detail with the patient. I discussed the assessment and treatment plan with the patient. The patient was provided an opportunity to ask questions and all were answered. The patient agreed with the plan and demonstrated an understanding of the instructions. The patient was advised to call back or seek an in-person office evaluation/go to [facility] at [facility] for any urgent or concerning symptoms. Please refer to After Visit Summary for other counseling recommendations.   I provided 15 minutes of face-to-face time during this encounter.  Return in about 2 weeks (around 04/03/2019) for MyChart ( Dr. Rosana Hoes ).  Future Appointments  Date Time Provider [place]  03/26/2019  2:00 PM [place], [place] for [facility], [facility] 03/20/2019

## 2019-03-20 NOTE — Progress Notes (Signed)
Virtual ROB   CC: concerned about fetal movement, wants to discuss 2 hr GTT results, and allergies.  Hemorrhoids and refill on Rx. *Had U/S today.

## 2019-03-21 ENCOUNTER — Other Ambulatory Visit: Payer: Self-pay

## 2019-03-21 ENCOUNTER — Other Ambulatory Visit: Payer: 59

## 2019-03-21 DIAGNOSIS — Z131 Encounter for screening for diabetes mellitus: Secondary | ICD-10-CM

## 2019-03-22 LAB — BASIC METABOLIC PANEL
BUN/Creatinine Ratio: 6 — ABNORMAL LOW (ref 9–23)
BUN: 4 mg/dL — ABNORMAL LOW (ref 6–20)
CO2: 17 mmol/L — ABNORMAL LOW (ref 20–29)
Calcium: 8.6 mg/dL — ABNORMAL LOW (ref 8.7–10.2)
Chloride: 105 mmol/L (ref 96–106)
Creatinine, Ser: 0.64 mg/dL (ref 0.57–1.00)
GFR calc Af Amer: 138 mL/min/{1.73_m2} (ref >59)
GFR calc non Af Amer: 119 mL/min/{1.73_m2} (ref >59)
Glucose: 87 mg/dL (ref 65–99)
Potassium: 3.9 mmol/L (ref 3.5–5.2)
Sodium: 136 mmol/L (ref 134–144)

## 2019-03-25 ENCOUNTER — Other Ambulatory Visit: Payer: 59

## 2019-03-26 ENCOUNTER — Telehealth: Payer: 59 | Admitting: *Deleted

## 2019-03-26 ENCOUNTER — Other Ambulatory Visit: Payer: Self-pay

## 2019-03-26 DIAGNOSIS — O24410 Gestational diabetes mellitus in pregnancy, diet controlled: Secondary | ICD-10-CM

## 2019-03-26 NOTE — Progress Notes (Signed)
This visit was completed via telephone due to the COVID-19 pandemic.   I spoke with Jackie [name], DOB: 1985/11/25 and verified that I was speaking with the correct person with two patient identifiers (full name and date of birth).   I discussed the limitations related to this kind of visit and the patient is willing to proceed.  Patient was spoken with on phone on 03/26/2019 for Gestational Diabetes self-management. EDD 05/28/2019. Patient states her A1c recently was 5.0% and her FBG with the most recent Glucose Tolerance Test was 93 mg/dl, other portion of test were all normal. She was uncomfortable coming to our office for training so we set up a phone visit. Patient states no history of GDM but her mother has diabetes and she sees her mother test her blood sugars often. Diet history obtained. Patient eats very good variety of all food groups. Beverages include water, tea and diet coke.  I reviewed basic concepts of spreading high carbohydrate foods throughout the day and validated that she is already doing that. She also walks during the day.  The following learning objectives were emailed to the patient :   States the definition of Gestational Diabetes  States why dietary management is important in controlling blood glucose  Describes the effects of carbohydrates on blood glucose levels  Demonstrates ability to create a balanced meal plan  Demonstrates carbohydrate counting   States when to check blood glucose levels  Demonstrates proper blood glucose monitoring techniques  States the effect of stress and exercise on blood glucose levels  States the importance of limiting caffeine and abstaining from alcohol and smoking  Plan:  Continue with current eating habits, they are just fine Begin reading food labels for Total Carbohydrate of foods Continue with your activity level by walking  or other activity daily as tolerated Begin checking BG before breakfast or 2 hours after first bite  of breakfast, lunch or dinner on alternate days as directed by MD  Baby Scripts:  Patient was introduced to [employer] and plans to use as record of BG electronically  Take medication if directed by MD  Patient already has a meter: Accu Chek Guide, but she has not used it yet. I informed her that there are videos online with instructions on how to use this meter if she needs assistance.  Patient instructed to test BG before breakfast or 2 hours after first bite of breakfast, lunch or dinner on alternate days as directed by MD  Patient instructed to monitor glucose levels: FBS: 60 - 95 mg/dl 2 hour: <120 mg/dl  Patient received the following handouts via email  Nutrition Diabetes and Pregnancy  Carbohydrate Counting List  Patient will be seen for follow-up as needed.

## 2019-04-08 ENCOUNTER — Telehealth: Payer: 59 | Admitting: Obstetrics and Gynecology

## 2019-04-11 ENCOUNTER — Encounter: Payer: Self-pay | Admitting: Family Medicine

## 2019-04-30 LAB — OB RESULTS CONSOLE GBS: GBS: NEGATIVE

## 2019-05-27 ENCOUNTER — Other Ambulatory Visit: Payer: Self-pay | Admitting: Obstetrics

## 2019-05-27 ENCOUNTER — Other Ambulatory Visit (HOSPITAL_COMMUNITY)
Admission: RE | Admit: 2019-05-27 | Discharge: 2019-05-27 | Disposition: A | Discharge status: Home / Self Care | Payer: 59 | Source: Ambulatory Visit | Attending: Obstetrics | Admitting: Obstetrics

## 2019-05-27 DIAGNOSIS — Z20828 Contact with and (suspected) exposure to other viral communicable diseases: Secondary | ICD-10-CM | POA: Insufficient documentation

## 2019-05-27 DIAGNOSIS — Z01812 Encounter for preprocedural laboratory examination: Secondary | ICD-10-CM | POA: Insufficient documentation

## 2019-05-27 LAB — SARS CORONAVIRUS 2 (TAT 6-24 HRS): SARS Coronavirus 2: NEGATIVE

## 2019-05-27 NOTE — H&P (Signed)
Jackie [name] is a 33 y.o. G1P0 at [redacted]w[redacted]d presenting for IOL at term. Pt notes rare contractions. Good fetal movement, No vaginal bleeding, not leaking fluid.  PNCare at [place] since 32 wks - late transfer of care at 32 wks from [place], adequate PN care prior to tx - GDM- given dx at [facility] based on FBS 90-110 but nl 2hr GTT. After improved educated GBS in low 90s, PP all <120. No meds - 39# wt gain - fetal growth, 37 wks, 7'8, 79% - dated by sure LMP   Prenatal Transfer Tool  Maternal Diabetes: No Genetic Screening: Normal Maternal Ultrasounds/Referrals: Normal Fetal Ultrasounds or other Referrals:  None Maternal Substance Abuse:  No Significant Maternal Medications:  None Significant Maternal Lab Results: Group B Strep negative     OB History    Gravida  1   Para      Term      Preterm      AB      Living        SAB      TAB      Ectopic      Multiple      Live Births             Past Medical History:  Diagnosis Date  . ADHD    No past surgical history on file. Family History: family history includes COPD in her maternal grandfather and maternal grandmother; Diabetes in her maternal grandfather and mother; Hypertension in her father and mother. Social History:  reports that she has never smoked. She has never used smokeless tobacco. She reports previous alcohol use. She reports current drug use.  Review of Systems - Negative except discomfort of pregnancy  Vitals:   05/29/19 0100 05/29/19 0253 05/29/19 0533 05/29/19 0728  BP:  106/62 112/75 110/64  Pulse:  69 75 77  Resp: 18 18 18 18   Temp:   (!) 97.5 F (36.4 C) 98.7 F (37.1 C)  TempSrc:   Oral Oral  Weight: 95.6 kg     Height: 5\' 8"  (1.727 m)        Prenatal labs: ABO, Rh: A/Positive/-- (05/04 1355) Antibody: Negative (05/04 1355) Rubella: 2.88 (05/04 1355) RPR: Non Reactive (08/27 0921)  HBsAg: Negative (05/04 1355)  HIV: Non Reactive (08/27 0921)  GBS:   neg 1 hr  Glucola abnl FBS on 2hr GTT  Genetic screening nl NIPS Anatomy US normal   Assessment/Plan: 33 y.o. G1P0 at [redacted]w[redacted]d - Elective IOL.  Unripe cvx, cytotec o/n, pit and possible cervical foley in am. Borderline pelvis GBS neg Watch BS q4hr in active labor  [name] 05/27/2019, 9:52 PM

## 2019-05-29 ENCOUNTER — Inpatient Hospital Stay (HOSPITAL_COMMUNITY): Payer: 59

## 2019-05-29 ENCOUNTER — Inpatient Hospital Stay (HOSPITAL_COMMUNITY)
Admission: AD | Admit: 2019-05-29 | Discharge: 2019-06-02 | DRG: 787 | Disposition: A | Discharge status: Home / Self Care | Payer: 59 | Attending: Obstetrics | Admitting: Obstetrics

## 2019-05-29 ENCOUNTER — Other Ambulatory Visit: Payer: Self-pay

## 2019-05-29 ENCOUNTER — Inpatient Hospital Stay (HOSPITAL_COMMUNITY): Payer: 59 | Admitting: Anesthesiology

## 2019-05-29 ENCOUNTER — Encounter (HOSPITAL_COMMUNITY): Payer: Self-pay

## 2019-05-29 DIAGNOSIS — D62 Acute posthemorrhagic anemia: Secondary | ICD-10-CM | POA: Diagnosis not present

## 2019-05-29 DIAGNOSIS — Z3A40 40 weeks gestation of pregnancy: Secondary | ICD-10-CM | POA: Diagnosis not present

## 2019-05-29 DIAGNOSIS — O9081 Anemia of the puerperium: Secondary | ICD-10-CM | POA: Diagnosis not present

## 2019-05-29 DIAGNOSIS — K5900 Constipation, unspecified: Secondary | ICD-10-CM | POA: Diagnosis not present

## 2019-05-29 DIAGNOSIS — Z20828 Contact with and (suspected) exposure to other viral communicable diseases: Secondary | ICD-10-CM | POA: Diagnosis present

## 2019-05-29 DIAGNOSIS — Z3401 Encounter for supervision of normal first pregnancy, first trimester: Secondary | ICD-10-CM

## 2019-05-29 DIAGNOSIS — O9089 Other complications of the puerperium, not elsewhere classified: Secondary | ICD-10-CM | POA: Diagnosis not present

## 2019-05-29 DIAGNOSIS — O26893 Other specified pregnancy related conditions, third trimester: Secondary | ICD-10-CM | POA: Diagnosis present

## 2019-05-29 DIAGNOSIS — R300 Dysuria: Secondary | ICD-10-CM | POA: Diagnosis not present

## 2019-05-29 DIAGNOSIS — O24420 Gestational diabetes mellitus in childbirth, diet controlled: Principal | ICD-10-CM | POA: Diagnosis present

## 2019-05-29 DIAGNOSIS — O24410 Gestational diabetes mellitus in pregnancy, diet controlled: Secondary | ICD-10-CM

## 2019-05-29 DIAGNOSIS — Z3490 Encounter for supervision of normal pregnancy, unspecified, unspecified trimester: Secondary | ICD-10-CM | POA: Diagnosis present

## 2019-05-29 LAB — CBC
HCT: 38.5 % (ref 36.0–46.0)
Hemoglobin: 12.8 g/dL (ref 12.0–15.0)
MCH: 32.2 pg (ref 26.0–34.0)
MCHC: 33.2 g/dL (ref 30.0–36.0)
MCV: 96.7 fL (ref 80.0–100.0)
Platelets: 227 10*3/uL (ref 150–400)
RBC: 3.98 MIL/uL (ref 3.87–5.11)
RDW: 14 % (ref 11.5–15.5)
WBC: 9 10*3/uL (ref 4.0–10.5)
nRBC: 0 % (ref 0.0–0.2)

## 2019-05-29 LAB — GLUCOSE, CAPILLARY
Glucose-Capillary: 107 mg/dL — ABNORMAL HIGH (ref 70–99)
Glucose-Capillary: 122 mg/dL — ABNORMAL HIGH (ref 70–99)
Glucose-Capillary: 74 mg/dL (ref 70–99)
Glucose-Capillary: 75 mg/dL (ref 70–99)
Glucose-Capillary: 76 mg/dL (ref 70–99)
Glucose-Capillary: 88 mg/dL (ref 70–99)

## 2019-05-29 LAB — TYPE AND SCREEN
ABO/RH(D): A POS
Antibody Screen: NEGATIVE

## 2019-05-29 LAB — RPR: RPR Ser Ql: NONREACTIVE

## 2019-05-29 LAB — ABO/RH: ABO/RH(D): A POS

## 2019-05-29 MED ORDER — DIPHENHYDRAMINE HCL 50 MG/ML IJ SOLN: 12.5000 mg | INTRAMUSCULAR | Status: DC | PRN

## 2019-05-29 MED ORDER — OXYTOCIN BOLUS FROM INFUSION: 500.0000 mL | Freq: Once | INTRAVENOUS | Status: DC

## 2019-05-29 MED ORDER — LACTATED RINGERS IV SOLN: 500.0000 mL | INTRAVENOUS | Status: DC | PRN

## 2019-05-29 MED ORDER — PHENYLEPHRINE 40 MCG/ML (10ML) SYRINGE FOR IV PUSH (FOR BLOOD PRESSURE SUPPORT): 80.0000 ug | PREFILLED_SYRINGE | INTRAVENOUS | Status: DC | PRN

## 2019-05-29 MED ORDER — EPHEDRINE 5 MG/ML INJ: 10.0000 mg | INTRAVENOUS | Status: DC | PRN

## 2019-05-29 MED ORDER — OXYTOCIN 40 UNITS IN NORMAL SALINE INFUSION - SIMPLE MED: 2.5000 [IU]/h | INTRAVENOUS | Status: DC

## 2019-05-29 MED ORDER — LACTATED RINGERS IV SOLN
500.0000 mL | Freq: Once | INTRAVENOUS | Status: AC
  Administered 2019-05-29: 500 mL via INTRAVENOUS

## 2019-05-29 MED ORDER — ONDANSETRON HCL 4 MG/2ML IJ SOLN
4.0000 mg | Freq: Four times a day (QID) | INTRAMUSCULAR | Status: DC | PRN
  Administered 2019-05-29: 4 mg via INTRAVENOUS
  Filled 2019-05-29: qty 2

## 2019-05-29 MED ORDER — OXYTOCIN 40 UNITS IN NORMAL SALINE INFUSION - SIMPLE MED
1.0000 m[IU]/min | INTRAVENOUS | Status: DC
  Administered 2019-05-29: 2 m[IU]/min via INTRAVENOUS
  Filled 2019-05-29: qty 1000

## 2019-05-29 MED ORDER — LIDOCAINE HCL (PF) 1 % IJ SOLN: 30.0000 mL | INTRAMUSCULAR | Status: DC | PRN

## 2019-05-29 MED ORDER — ACETAMINOPHEN 325 MG PO TABS: 650.0000 mg | ORAL_TABLET | ORAL | Status: DC | PRN

## 2019-05-29 MED ORDER — OXYCODONE-ACETAMINOPHEN 5-325 MG PO TABS: 1.0000 | ORAL_TABLET | ORAL | Status: DC | PRN

## 2019-05-29 MED ORDER — OXYCODONE-ACETAMINOPHEN 5-325 MG PO TABS: 2.0000 | ORAL_TABLET | ORAL | Status: DC | PRN

## 2019-05-29 MED ORDER — TERBUTALINE SULFATE 1 MG/ML IJ SOLN: 0.2500 mg | Freq: Once | INTRAMUSCULAR | Status: DC | PRN

## 2019-05-29 MED ORDER — FENTANYL CITRATE (PF) 100 MCG/2ML IJ SOLN: 100.0000 ug | INTRAMUSCULAR | Status: DC | PRN

## 2019-05-29 MED ORDER — SOD CITRATE-CITRIC ACID 500-334 MG/5ML PO SOLN
30.0000 mL | ORAL | Status: DC | PRN
  Administered 2019-05-30: 30 mL via ORAL
  Filled 2019-05-29: qty 30

## 2019-05-29 MED ORDER — LIDOCAINE HCL (PF) 1 % IJ SOLN
INTRAMUSCULAR | Status: DC | PRN
  Administered 2019-05-29: 6 mL via EPIDURAL

## 2019-05-29 MED ORDER — LACTATED RINGERS IV SOLN
INTRAVENOUS | Status: DC
  Administered 2019-05-29 (×4): via INTRAVENOUS

## 2019-05-29 MED ORDER — FENTANYL-BUPIVACAINE-NACL 0.5-0.125-0.9 MG/250ML-% EP SOLN
12.0000 mL/h | EPIDURAL | Status: DC | PRN
  Filled 2019-05-29: qty 250

## 2019-05-29 MED ORDER — PROMETHAZINE HCL 25 MG/ML IJ SOLN: 12.5000 mg | Freq: Once | INTRAMUSCULAR | Status: DC

## 2019-05-29 MED ORDER — SODIUM CHLORIDE (PF) 0.9 % IJ SOLN
INTRAMUSCULAR | Status: DC | PRN
  Administered 2019-05-29: 12 mL/h via EPIDURAL

## 2019-05-29 MED ORDER — MISOPROSTOL 25 MCG QUARTER TABLET
25.0000 ug | ORAL_TABLET | ORAL | Status: AC | PRN
  Administered 2019-05-29 (×2): 25 ug via VAGINAL
  Filled 2019-05-29 (×2): qty 1

## 2019-05-29 NOTE — Progress Notes (Signed)
S: Doing well, no complaints, pain well controlled with epidural  O: BP 122/74   Pulse 69   Temp 98 F (36.7 C) (Oral)   Resp 17   Ht 5\' 8"  (1.727 m)   Wt 95.6 kg   LMP 08/21/2018 (Exact Date)   SpO2 100%   BMI 32.05 kg/m    FHT:  FHR: 120s bpm, variability: moderate,  accelerations:  Present,  decelerations:  Absent UC:   Irregular, not tracing well since epidural SVE:   Dilation: 4 Effacement (%): Thick Station: -3 Exam by:: Dr. Pamala Hurry   A / P:  33 y.o.  OB History  Gravida Para Term Preterm AB Living  1 0 0 0 0 0  SAB TAB Ectopic Multiple Live Births  0 0 0 0 0   at [redacted]w[redacted]d Induction of labor at term, slow progress.  Patient now with epidural due to recent onset of active labor.  Fetal Wellbeing:  Category I Pain Control:  Epidural  Anticipated MOD:  Working towards vaginal delivery though suspect LGA and baby station still high  [employer] 05/29/2019, 6:03 PM

## 2019-05-29 NOTE — Anesthesia Procedure Notes (Signed)
Epidural Patient location during procedure: OB Start time: 05/29/2019 5:27 PM End time: 05/29/2019 5:31 PM  Staffing Anesthesiologist: Janeece Riggers, MD  Preanesthetic Checklist Completed: patient identified, site marked, surgical consent, pre-op evaluation, timeout performed, IV checked, risks and benefits discussed and monitors and equipment checked  Epidural Patient position: sitting Prep: site prepped and draped and DuraPrep Patient monitoring: continuous pulse ox and blood pressure Approach: midline Location: L3-L4 Injection technique: LOR air  Needle:  Needle type: Tuohy  Needle gauge: 17 G Needle length: 9 cm and 9 Needle insertion depth: 7 cm Catheter type: closed end flexible Catheter size: 19 Gauge Catheter at skin depth: 12 cm Test dose: negative  Assessment Events: blood not aspirated, injection not painful, no injection resistance, negative IV test and no paresthesia

## 2019-05-29 NOTE — Progress Notes (Signed)
S: Doing well, no complaints, pain well controlled as rare contractions, not in labor, planning epidural with active labor. No VB, no LOF, resting well  O: BP 113/71   Pulse 75   Temp 98 F (36.7 C) (Oral)   Resp 18   Ht 5\' 8"  (1.727 m)   Wt 95.6 kg   LMP 08/21/2018 (Exact Date)   BMI 32.05 kg/m    FHT:  FHR: 130sa bpm, variability: moderate,  accelerations:  Present,  decelerations:  Absent UC:   irritibility q 2 min SVE:   Dilation: 4 Effacement (%): Thick Station: -3 Exam by:: Dr. Pamala Hurry AROM clear  Gen: well appearing, no distress Abd: gravid NT LE: NT, trace edema  A / P:  33 y.o.  OB History  Gravida Para Term Preterm AB Living  1 0 0 0 0 0  SAB TAB Ectopic Multiple Live Births  0 0 0 0 0   at [redacted]w[redacted]d IOL at term, suspect LGA, s/p cytotec x 2, foley and slow progression of pitocin. AROM now, hoping to get in active labor  Fetal Wellbeing:  Category I Pain Control:  Labor support without medications  Anticipated MOD:  NSVD  [name] 05/29/2019, 2:56 PM

## 2019-05-29 NOTE — Progress Notes (Signed)
Patient ID: Jackie [name], female   DOB: Dec 02, 1985, 33 y.o.   MRN: [id number] S: MD request for CB placement. Patient sitting in bed, comfortable after 2 doses cytotec overnight.  Reviewed procedure, r/b discussed, agrees.   O: Vitals:   05/29/19 0533 05/29/19 0728 05/29/19 0836 05/29/19 1000  BP: 112/75 110/64 104/66 114/76  Pulse: 75 77 77 75  Resp: 18 18 18 18   Temp: (!) 97.5 F (36.4 C) 98.7 F (37.1 C)    TempSrc: Oral Oral    Weight:      Height:         FHT:  FHR: 130 bpm, variability: moderate,  accelerations:  Present,  decelerations:  Absent UC:   irregular, every 2-5 minutes SVE:   Dilation: 1.5 Effacement (%): 50 Station: -1 Exam by:: Jackie [name] CNM Cvx digitally dilated to 1-2 cm, CB placed with ease, patient tolerated well. Balloon inflated w/ 60 cc fluid, traction to leg.   A / P: IOL at term Cervical ripening continued w/ CB and low dose Pitocin until expulsion, then resume Pitocin titrate per protocol Fetal Wellbeing:  Category I Pain Control:  Labor support without medications for now, epidural in active labor Recc position changes, BB use.  Anticipated MOD:  NSVD  Juliene Pina, CNM, MSN 05/29/2019, 10:28 AM

## 2019-05-29 NOTE — Anesthesia Preprocedure Evaluation (Signed)
Anesthesia Evaluation  Patient identified by MRN, date of birth, ID band Patient awake    Reviewed: Allergy & Precautions, H&P , NPO status , Patient's Chart, lab work & pertinent test results, reviewed documented beta blocker date and time   Airway Mallampati: II  TM Distance: >3 FB Neck ROM: full    Dental no notable dental hx.    Pulmonary neg pulmonary ROS,    Pulmonary exam normal breath sounds clear to auscultation       Cardiovascular negative cardio ROS Normal cardiovascular exam Rhythm:regular Rate:Normal     Neuro/Psych negative neurological ROS  negative psych ROS   GI/Hepatic negative GI ROS, Neg liver ROS,   Endo/Other  diabetes, Gestational  Renal/GU negative Renal ROS  negative genitourinary   Musculoskeletal   Abdominal   Peds  Hematology negative hematology ROS (+)   Anesthesia Other Findings   Reproductive/Obstetrics (+) Pregnancy                             Anesthesia Physical Anesthesia Plan  ASA: III  Anesthesia Plan: Epidural   Post-op Pain Management:    Induction:   PONV Risk Score and Plan:   Airway Management Planned:   Additional Equipment:   Intra-op Plan:   Post-operative Plan:   Informed Consent: I have reviewed the patients History and Physical, chart, labs and discussed the procedure including the risks, benefits and alternatives for the proposed anesthesia with the patient or authorized representative who has indicated his/her understanding and acceptance.       Plan Discussed with:   Anesthesia Plan Comments:         Anesthesia Quick Evaluation

## 2019-05-30 ENCOUNTER — Encounter (HOSPITAL_COMMUNITY)
Admission: AD | Disposition: A | Discharge status: Home / Self Care | Payer: Self-pay | Source: Home / Self Care | Attending: Obstetrics

## 2019-05-30 ENCOUNTER — Encounter (HOSPITAL_COMMUNITY): Payer: Self-pay | Admitting: *Deleted

## 2019-05-30 DIAGNOSIS — Z3490 Encounter for supervision of normal pregnancy, unspecified, unspecified trimester: Secondary | ICD-10-CM | POA: Diagnosis present

## 2019-05-30 LAB — GLUCOSE, CAPILLARY
Glucose-Capillary: 81 mg/dL (ref 70–99)
Glucose-Capillary: 89 mg/dL (ref 70–99)
Glucose-Capillary: 100 mg/dL — ABNORMAL HIGH (ref 70–99)
Glucose-Capillary: 117 mg/dL — ABNORMAL HIGH (ref 70–99)

## 2019-05-30 SURGERY — Surgical Case
Anesthesia: Epidural

## 2019-05-30 MED ORDER — ZOLPIDEM TARTRATE 5 MG PO TABS: 5.0000 mg | ORAL_TABLET | Freq: Every evening | ORAL | Status: DC | PRN

## 2019-05-30 MED ORDER — DIPHENHYDRAMINE HCL 50 MG/ML IJ SOLN: 12.5000 mg | INTRAMUSCULAR | Status: DC | PRN

## 2019-05-30 MED ORDER — SODIUM CHLORIDE 0.9% FLUSH: 3.0000 mL | INTRAVENOUS | Status: DC | PRN

## 2019-05-30 MED ORDER — SIMETHICONE 80 MG PO CHEW: 80.0000 mg | CHEWABLE_TABLET | ORAL | Status: DC | PRN

## 2019-05-30 MED ORDER — TERBUTALINE SULFATE 1 MG/ML IJ SOLN: 0.2500 mg | Freq: Once | INTRAMUSCULAR | Status: DC | PRN

## 2019-05-30 MED ORDER — MENTHOL 3 MG MT LOZG: 1.0000 | LOZENGE | OROMUCOSAL | Status: DC | PRN

## 2019-05-30 MED ORDER — ONDANSETRON HCL 4 MG/2ML IJ SOLN
INTRAMUSCULAR | Status: AC
  Filled 2019-05-30: qty 2

## 2019-05-30 MED ORDER — NALBUPHINE SYRINGE 5 MG/0.5 ML
5.0000 mg | INJECTION | Freq: Once | INTRAMUSCULAR | Status: DC | PRN
  Filled 2019-05-30: qty 0.5

## 2019-05-30 MED ORDER — FENTANYL CITRATE (PF) 100 MCG/2ML IJ SOLN
INTRAMUSCULAR | Status: AC
  Filled 2019-05-30: qty 2

## 2019-05-30 MED ORDER — COCONUT OIL OIL
1.0000 "application " | TOPICAL_OIL | Status: DC | PRN
  Administered 2019-05-31: 1 via TOPICAL

## 2019-05-30 MED ORDER — METOCLOPRAMIDE HCL 5 MG/ML IJ SOLN
INTRAMUSCULAR | Status: AC
  Filled 2019-05-30: qty 2

## 2019-05-30 MED ORDER — SODIUM CHLORIDE 0.9 % IR SOLN
Status: DC | PRN
  Administered 2019-05-30: 1

## 2019-05-30 MED ORDER — METOCLOPRAMIDE HCL 5 MG/ML IJ SOLN
INTRAMUSCULAR | Status: DC | PRN
  Administered 2019-05-30 (×2): 5 mg via INTRAVENOUS

## 2019-05-30 MED ORDER — LIDOCAINE-EPINEPHRINE (PF) 2 %-1:200000 IJ SOLN
INTRAMUSCULAR | Status: AC
  Filled 2019-05-30: qty 10

## 2019-05-30 MED ORDER — OXYTOCIN 40 UNITS IN NORMAL SALINE INFUSION - SIMPLE MED: 2.5000 [IU]/h | INTRAVENOUS | Status: AC

## 2019-05-30 MED ORDER — SIMETHICONE 80 MG PO CHEW
80.0000 mg | CHEWABLE_TABLET | Freq: Three times a day (TID) | ORAL | Status: DC
  Administered 2019-05-30 – 2019-06-02 (×6): 80 mg via ORAL
  Filled 2019-05-30 (×7): qty 1

## 2019-05-30 MED ORDER — CEFAZOLIN SODIUM-DEXTROSE 2-3 GM-%(50ML) IV SOLR
INTRAVENOUS | Status: DC | PRN
  Administered 2019-05-30: 2 g via INTRAVENOUS

## 2019-05-30 MED ORDER — WITCH HAZEL-GLYCERIN EX PADS
1.0000 "application " | MEDICATED_PAD | CUTANEOUS | Status: DC | PRN
  Administered 2019-05-31: 1 via TOPICAL

## 2019-05-30 MED ORDER — OXYTOCIN 40 UNITS IN NORMAL SALINE INFUSION - SIMPLE MED: 2.5000 [IU]/h | INTRAVENOUS | Status: DC

## 2019-05-30 MED ORDER — IBUPROFEN 600 MG PO TABS
600.0000 mg | ORAL_TABLET | Freq: Three times a day (TID) | ORAL | Status: DC
  Administered 2019-05-30 – 2019-05-31 (×3): 600 mg via ORAL
  Filled 2019-05-30 (×3): qty 1

## 2019-05-30 MED ORDER — PHENYLEPHRINE HCL (PRESSORS) 10 MG/ML IV SOLN
INTRAVENOUS | Status: DC | PRN
  Administered 2019-05-30: 80 ug via INTRAVENOUS
  Administered 2019-05-30: 40 ug via INTRAVENOUS
  Administered 2019-05-30: 80 ug via INTRAVENOUS

## 2019-05-30 MED ORDER — NALBUPHINE SYRINGE 5 MG/0.5 ML
5.0000 mg | INJECTION | INTRAMUSCULAR | Status: DC | PRN
  Filled 2019-05-30: qty 0.5

## 2019-05-30 MED ORDER — SIMETHICONE 80 MG PO CHEW
80.0000 mg | CHEWABLE_TABLET | ORAL | Status: DC
  Administered 2019-05-30 – 2019-06-02 (×3): 80 mg via ORAL
  Filled 2019-05-30 (×3): qty 1

## 2019-05-30 MED ORDER — MEPERIDINE HCL 25 MG/ML IJ SOLN
INTRAMUSCULAR | Status: AC
  Filled 2019-05-30: qty 1

## 2019-05-30 MED ORDER — SCOPOLAMINE 1 MG/3DAYS TD PT72
1.0000 | MEDICATED_PATCH | Freq: Once | TRANSDERMAL | Status: AC
  Administered 2019-05-30: 1.5 mg via TRANSDERMAL
  Filled 2019-05-30: qty 1

## 2019-05-30 MED ORDER — ONDANSETRON HCL 4 MG/2ML IJ SOLN
INTRAMUSCULAR | Status: DC | PRN
  Administered 2019-05-30: 4 mg via INTRAVENOUS

## 2019-05-30 MED ORDER — DEXAMETHASONE SODIUM PHOSPHATE 10 MG/ML IJ SOLN
INTRAMUSCULAR | Status: AC
  Filled 2019-05-30: qty 1

## 2019-05-30 MED ORDER — ONDANSETRON HCL 4 MG/2ML IJ SOLN: 4.0000 mg | Freq: Four times a day (QID) | INTRAMUSCULAR | Status: DC | PRN

## 2019-05-30 MED ORDER — NALOXONE HCL 4 MG/10ML IJ SOLN
1.0000 ug/kg/h | INTRAVENOUS | Status: DC | PRN
  Filled 2019-05-30: qty 5

## 2019-05-30 MED ORDER — MORPHINE SULFATE (PF) 0.5 MG/ML IJ SOLN
INTRAMUSCULAR | Status: AC
  Filled 2019-05-30: qty 10

## 2019-05-30 MED ORDER — DIPHENHYDRAMINE HCL 25 MG PO CAPS: 25.0000 mg | ORAL_CAPSULE | ORAL | Status: DC | PRN

## 2019-05-30 MED ORDER — TETANUS-DIPHTH-ACELL PERTUSSIS 5-2.5-18.5 LF-MCG/0.5 IM SUSP: 0.5000 mL | Freq: Once | INTRAMUSCULAR | Status: DC

## 2019-05-30 MED ORDER — OXYCODONE HCL 5 MG PO TABS
5.0000 mg | ORAL_TABLET | ORAL | Status: DC | PRN
  Administered 2019-06-01 – 2019-06-02 (×4): 5 mg via ORAL
  Filled 2019-05-30 (×4): qty 1

## 2019-05-30 MED ORDER — FENTANYL CITRATE (PF) 100 MCG/2ML IJ SOLN
25.0000 ug | INTRAMUSCULAR | Status: DC | PRN
  Administered 2019-05-30 (×2): 50 ug via INTRAVENOUS

## 2019-05-30 MED ORDER — SOD CITRATE-CITRIC ACID 500-334 MG/5ML PO SOLN: 30.0000 mL | ORAL | Status: DC | PRN

## 2019-05-30 MED ORDER — OXYTOCIN 10 UNIT/ML IJ SOLN
INTRAMUSCULAR | Status: DC | PRN
  Administered 2019-05-30: 40 [IU] via INTRAMUSCULAR

## 2019-05-30 MED ORDER — NALOXONE HCL 0.4 MG/ML IJ SOLN: 0.4000 mg | INTRAMUSCULAR | Status: DC | PRN

## 2019-05-30 MED ORDER — SENNOSIDES-DOCUSATE SODIUM 8.6-50 MG PO TABS
2.0000 | ORAL_TABLET | ORAL | Status: DC
  Administered 2019-05-30 – 2019-06-02 (×3): 2 via ORAL
  Filled 2019-05-30 (×3): qty 2

## 2019-05-30 MED ORDER — MEPERIDINE HCL 25 MG/ML IJ SOLN
INTRAMUSCULAR | Status: DC | PRN
  Administered 2019-05-30: 12.5 mg via INTRAVENOUS

## 2019-05-30 MED ORDER — PRENATAL MULTIVITAMIN CH
1.0000 | ORAL_TABLET | Freq: Every day | ORAL | Status: DC
  Administered 2019-05-30 – 2019-06-02 (×4): 1 via ORAL
  Filled 2019-05-30 (×4): qty 1

## 2019-05-30 MED ORDER — MORPHINE SULFATE (PF) 0.5 MG/ML IJ SOLN
INTRAMUSCULAR | Status: DC | PRN
  Administered 2019-05-30: 3 mg via EPIDURAL

## 2019-05-30 MED ORDER — LACTATED RINGERS IV SOLN: INTRAVENOUS | Status: DC

## 2019-05-30 MED ORDER — ONDANSETRON HCL 4 MG/2ML IJ SOLN
4.0000 mg | Freq: Three times a day (TID) | INTRAMUSCULAR | Status: DC | PRN
  Administered 2019-05-30: 4 mg via INTRAVENOUS
  Filled 2019-05-30: qty 2

## 2019-05-30 MED ORDER — FENTANYL-BUPIVACAINE-NACL 0.5-0.125-0.9 MG/250ML-% EP SOLN: 12.0000 mL/h | EPIDURAL | Status: DC | PRN

## 2019-05-30 MED ORDER — LACTATED RINGERS IV SOLN
INTRAVENOUS | Status: DC | PRN
  Administered 2019-05-30 (×2): via INTRAVENOUS

## 2019-05-30 MED ORDER — PHENYLEPHRINE 40 MCG/ML (10ML) SYRINGE FOR IV PUSH (FOR BLOOD PRESSURE SUPPORT)
PREFILLED_SYRINGE | INTRAVENOUS | Status: AC
  Filled 2019-05-30: qty 10

## 2019-05-30 MED ORDER — SODIUM CHLORIDE 0.9 % IV SOLN
INTRAVENOUS | Status: DC | PRN
  Administered 2019-05-30: 10:00:00 via INTRAVENOUS

## 2019-05-30 MED ORDER — CEFAZOLIN SODIUM-DEXTROSE 2-4 GM/100ML-% IV SOLN
INTRAVENOUS | Status: AC
  Filled 2019-05-30: qty 100

## 2019-05-30 MED ORDER — SODIUM BICARBONATE 8.4 % IV SOLN
INTRAVENOUS | Status: DC | PRN
  Administered 2019-05-30: 5 mL via EPIDURAL
  Administered 2019-05-30: 10 mL via EPIDURAL

## 2019-05-30 MED ORDER — OXYTOCIN BOLUS FROM INFUSION: 500.0000 mL | Freq: Once | INTRAVENOUS | Status: DC

## 2019-05-30 MED ORDER — MISOPROSTOL 100 MCG PO TABS: 25.0000 ug | ORAL_TABLET | ORAL | Status: DC | PRN

## 2019-05-30 MED ORDER — DIBUCAINE (PERIANAL) 1 % EX OINT
1.0000 "application " | TOPICAL_OINTMENT | CUTANEOUS | Status: DC | PRN
  Administered 2019-05-31: 1 via RECTAL
  Filled 2019-05-30: qty 28

## 2019-05-30 MED ORDER — MEPERIDINE HCL 25 MG/ML IJ SOLN: 6.2500 mg | INTRAMUSCULAR | Status: DC | PRN

## 2019-05-30 MED ORDER — DEXAMETHASONE SODIUM PHOSPHATE 4 MG/ML IJ SOLN
INTRAMUSCULAR | Status: DC | PRN
  Administered 2019-05-30: 10 mg via INTRAVENOUS

## 2019-05-30 MED ORDER — OXYTOCIN 40 UNITS IN NORMAL SALINE INFUSION - SIMPLE MED
INTRAVENOUS | Status: AC
  Filled 2019-05-30: qty 1000

## 2019-05-30 MED ORDER — PROMETHAZINE HCL 25 MG/ML IJ SOLN: 6.2500 mg | INTRAMUSCULAR | Status: DC | PRN

## 2019-05-30 MED ORDER — ACETAMINOPHEN 325 MG PO TABS
650.0000 mg | ORAL_TABLET | ORAL | Status: DC | PRN
  Administered 2019-05-30 – 2019-06-02 (×10): 650 mg via ORAL
  Filled 2019-05-30 (×11): qty 2

## 2019-05-30 MED ORDER — DIPHENHYDRAMINE HCL 25 MG PO CAPS: 25.0000 mg | ORAL_CAPSULE | Freq: Four times a day (QID) | ORAL | Status: DC | PRN

## 2019-05-30 MED ORDER — OXYTOCIN 40 UNITS IN NORMAL SALINE INFUSION - SIMPLE MED: 1.0000 m[IU]/min | INTRAVENOUS | Status: DC

## 2019-05-30 MED ORDER — LACTATED RINGERS IV SOLN: 500.0000 mL | INTRAVENOUS | Status: DC | PRN

## 2019-05-30 MED ORDER — LIDOCAINE HCL (PF) 1 % IJ SOLN
30.0000 mL | INTRAMUSCULAR | Status: DC | PRN
  Filled 2019-05-30: qty 30

## 2019-05-30 MED ORDER — SODIUM BICARBONATE 8.4 % IV SOLN
INTRAVENOUS | Status: AC
  Filled 2019-05-30: qty 50

## 2019-05-30 SURGICAL SUPPLY — 39 items
BENZOIN TINCTURE PRP APPL 2/3 (GAUZE/BANDAGES/DRESSINGS) ×2 IMPLANT
CHLORAPREP W/TINT 26ML (MISCELLANEOUS) ×3 IMPLANT
CLAMP CORD UMBIL (MISCELLANEOUS) IMPLANT
CLOSURE STERI STRIP 1/2 X4 (GAUZE/BANDAGES/DRESSINGS) ×2 IMPLANT
CLOSURE WOUND 1/2 X4 (GAUZE/BANDAGES/DRESSINGS)
CLOTH BEACON ORANGE TIMEOUT ST (SAFETY) ×3 IMPLANT
DRSG OPSITE POSTOP 4X10 (GAUZE/BANDAGES/DRESSINGS) ×3 IMPLANT
ELECT REM PT RETURN 9FT ADLT (ELECTROSURGICAL) ×3
ELECTRODE REM PT RTRN 9FT ADLT (ELECTROSURGICAL) ×1 IMPLANT
EXTRACTOR VACUUM M CUP 4 TUBE (SUCTIONS) IMPLANT
EXTRACTOR VACUUM M CUP 4' TUBE (SUCTIONS)
GLOVE BIO SURGEON STRL SZ 6.5 (GLOVE) ×2 IMPLANT
GLOVE BIO SURGEONS STRL SZ 6.5 (GLOVE) ×1
GLOVE BIOGEL PI IND STRL 7.0 (GLOVE) ×2 IMPLANT
GLOVE BIOGEL PI INDICATOR 7.0 (GLOVE) ×4
GOWN STRL REUS W/TWL LRG LVL3 (GOWN DISPOSABLE) ×6 IMPLANT
KIT ABG SYR 3ML LUER SLIP (SYRINGE) IMPLANT
NDL HYPO 25X5/8 SAFETYGLIDE (NEEDLE) IMPLANT
NEEDLE HYPO 22GX1.5 SAFETY (NEEDLE) IMPLANT
NEEDLE HYPO 25X5/8 SAFETYGLIDE (NEEDLE) IMPLANT
NS IRRIG 1000ML POUR BTL (IV SOLUTION) ×3 IMPLANT
PACK C SECTION WH (CUSTOM PROCEDURE TRAY) ×3 IMPLANT
PAD OB MATERNITY 4.3X12.25 (PERSONAL CARE ITEMS) ×3 IMPLANT
PENCIL SMOKE EVAC W/HOLSTER (ELECTROSURGICAL) ×3 IMPLANT
STRIP CLOSURE SKIN 1/2X4 (GAUZE/BANDAGES/DRESSINGS) IMPLANT
SUT MON AB 4-0 PS1 27 (SUTURE) ×3 IMPLANT
SUT PLAIN 0 NONE (SUTURE) IMPLANT
SUT PLAIN 2 0 (SUTURE) ×2
SUT PLAIN 2 0 XLH (SUTURE) IMPLANT
SUT PLAIN ABS 2-0 CT1 27XMFL (SUTURE) IMPLANT
SUT VIC AB 0 CT1 36 (SUTURE) ×6 IMPLANT
SUT VIC AB 0 CTX 36 (SUTURE) ×6
SUT VIC AB 0 CTX36XBRD ANBCTRL (SUTURE) ×2 IMPLANT
SUT VIC AB 2-0 CT1 27 (SUTURE) ×2
SUT VIC AB 2-0 CT1 TAPERPNT 27 (SUTURE) ×1 IMPLANT
SYR CONTROL 10ML LL (SYRINGE) IMPLANT
TOWEL OR 17X24 6PK STRL BLUE (TOWEL DISPOSABLE) ×3 IMPLANT
TRAY FOLEY W/BAG SLVR 14FR LF (SET/KITS/TRAYS/PACK) IMPLANT
WATER STERILE IRR 1000ML POUR (IV SOLUTION) ×3 IMPLANT

## 2019-05-30 NOTE — Anesthesia Postprocedure Evaluation (Signed)
Anesthesia Post Note  Patient: Jackie [name]  Procedure(s) Performed: CESAREAN SECTION (N/A )     Patient location during evaluation: PACU Anesthesia Type: MAC and Epidural Level of consciousness: awake and alert Pain management: pain level controlled Vital Signs Assessment: post-procedure vital signs reviewed and stable Respiratory status: spontaneous breathing and respiratory function stable Cardiovascular status: blood pressure returned to baseline and stable Postop Assessment: spinal receding Anesthetic complications: no    Last Vitals:  Vitals:   05/30/19 1130 05/30/19 1200  BP: (!) 101/50 109/75  Pulse: 83 63  Resp: (!) 22 16  Temp:  37.2 C  SpO2: 100% 96%    Last Pain:  Vitals:   05/30/19 1200  TempSrc: Axillary  PainSc: 2    Pain Goal:                   Desyre Calma DANIEL

## 2019-05-30 NOTE — Op Note (Signed)
05/30/2019  10:21 AM  PATIENT:  Jackie [name]  33 y.o. female  PRE-OPERATIVE DIAGNOSIS:  Primary cesarean section, Arrest of descent  POST-OPERATIVE DIAGNOSIS:  Primary cesarean section, Arrest of descent. Presistant OP  PROCEDURE:  Procedure(s): CESAREAN SECTION (N/A)  Primary low transverse cesarean section with 2 layer closure  SURGEON:  Surgeon(s) and Role:    Aloha Gell, MD - Primary  PHYSICIAN ASSISTANT:   ASSISTANTS: Artelia Laroche, CNM  ANESTHESIA:   epidural  EBL: Per anesthesia  BLOOD ADMINISTERED:none  DRAINS: Urinary Catheter (Foley)   LOCAL MEDICATIONS USED:  NONE  SPECIMEN:  Source of Specimen:  Placenta  DISPOSITION OF SPECIMEN:  Labor and delivery for disposal  COUNTS:  YES  TOURNIQUET:  * No tourniquets in log *  DICTATION: .Dragon Dictation  PLAN OF CARE: Admit to inpatient   PATIENT DISPOSITION:  PACU - hemodynamically stable.   Delay start of Pharmacological VTE agent (>24hrs) due to surgical blood loss or risk of bleeding: yes     Findings:  @BABYSEXEBC @ infant,  APGAR (1 MIN):   APGAR (5 MINS):   APGAR (10 MINS):   Normal uterus, tubes and ovaries, normal placenta. 3VC, clear amniotic fluid, persistent OP position, female  EBL: Per nursing notes cc Antibiotics: 2g Ancef Complications: none  Indications: This is a 33 y.o. year-old, G1 at [redacted]w[redacted]d admitted for elective induction of labor at term, borderline pelvis, suspected LGA patient was admitted for cervical ripening overnight.  Cervical Foley placed in the a.m with initiation of Pitocin.  Artificial rupture of membranes.  Patient had slow entry into active labor but then slow but adequate progress from 5 to 10 cm.  She began pushing after 30 minutes of passive descent from the 0 station.  Despite 3 hours of pushing no descent past the 0 to +1 station some increased caput and molding gave some falls hope of further descent.  Careful exam revealed significant change in descent  after 3 hours and persistent OP position.. Risks benefits and alternatives of the procedure were discussed with the patient who agreed to proceed  Procedure:  After informed consent was obtained the patient was taken to the operating room where epidural anesthesia was found to be adequate.  She was prepped and draped in the normal sterile fashion in dorsal supine position with a leftward tilt.  A foley catheter was in place.  A Pfannenstiel skin incision was made 2 cm above the pubic symphysis in the midline with the scalpel.  Dissection was carried down with the Bovie cautery until the fascia was reached. The fascia was incised in the midline. The incision was extended laterally with the Mayo scissors. The inferior aspect of the fascial incision was grasped with the Coker clamps, elevated up and the underlying rectus muscles were dissected off sharply. The superior aspect of the fascial incision was grasped with the Coker clamps elevated up and the underlying rectus muscles were dissected off sharply.  The peritoneum was entered bluntly. The peritoneal incision was extended superiorly and inferiorly with good visualization of the bladder.  A bladder flap was created sharply the bladder blade was inserted and palpation was done to assess the fetal position and the location of the uterine vessels. The lower segment of the uterus was incised sharply with the scalpel and extended  bluntly in the cephalo-caudal fashion. The infant was grasped, brought to the incision,  rotated and the infant was delivered with fundal pressure. The nose and mouth were bulb suctioned. The cord was  clamped and cut after 1 minute delay. The infant was handed off to the waiting pediatrician. The placenta was expressed. The uterus was exteriorized. The uterus was cleared of all clots and debris. The uterine incision was repaired with 0 Vicryl in a running locked fashion.  A second layer of the same suture was used in an imbricating fashion  to obtain excellent hemostasis.  3 additional figure-of-eight sutures were placed in the midpoint of the incision the uterus was then returned to the abdomen, the gutters were cleared of all clots and debris. The uterine incision was reinspected and found to be hemostatic. The peritoneum was grasped and closed with 2-0 Vicryl in a running fashion. The cut muscle edges and the underside of the fascia were inspected and found to be hemostatic. The fascia was closed with 0 Vicryl in a single layer . The subcutaneous tissue was irrigated. Scarpa's layer was closed with a 2-0 plain gut suture. The skin was closed with a 4-0 Monocryl in a single layer. The patient tolerated the procedure well. Sponge lap and needle counts were correct x3 and patient was taken to the recovery room in a stable condition.  Lendon Colonel 05/30/2019 10:23 AM

## 2019-05-30 NOTE — Brief Op Note (Signed)
05/30/2019  10:21 AM  PATIENT:  Jackie [name]  33 y.o. female  PRE-OPERATIVE DIAGNOSIS:  Primary cesarean section, Arrest of descent  POST-OPERATIVE DIAGNOSIS:  Primary cesarean section, Arrest of descent. Presistant OP  PROCEDURE:  Procedure(s): CESAREAN SECTION (N/A)  Primary low transverse cesarean section with 2 layer closure  SURGEON:  Surgeon(s) and Role:    Aloha Gell, MD - Primary  PHYSICIAN ASSISTANT:   ASSISTANTS: Artelia Laroche, CNM  ANESTHESIA:   epidural  EBL: Per anesthesia  BLOOD ADMINISTERED:none  DRAINS: Urinary Catheter (Foley)   LOCAL MEDICATIONS USED:  NONE  SPECIMEN:  Source of Specimen:  Placenta  DISPOSITION OF SPECIMEN:  Labor and delivery for disposal  COUNTS:  YES  TOURNIQUET:  * No tourniquets in log *  DICTATION: .Dragon Dictation  PLAN OF CARE: Admit to inpatient   PATIENT DISPOSITION:  PACU - hemodynamically stable.   Delay start of Pharmacological VTE agent (>24hrs) due to surgical blood loss or risk of bleeding: yes

## 2019-05-30 NOTE — Transfer of Care (Signed)
Immediate Anesthesia Transfer of Care Note  Patient: Jackie [name]  Procedure(s) Performed: CESAREAN SECTION (N/A )  Patient Location: PACU  Anesthesia Type:Epidural  Level of Consciousness: awake, alert  and oriented  Airway & Oxygen Therapy: Patient Spontanous Breathing  Post-op Assessment: Report given to RN and Post -op Vital signs reviewed and stable  Post vital signs: Reviewed and stable  Last Vitals:  Vitals Value Taken Time  BP 99/70 05/30/19 1030  Temp    Pulse 75 05/30/19 1031  Resp 19 05/30/19 1031  SpO2 100 % 05/30/19 1031  Vitals shown include unvalidated device data.  Last Pain:  Vitals:   05/30/19 0802  TempSrc: Oral  PainSc:          Complications: No apparent anesthesia complications

## 2019-05-30 NOTE — Lactation Note (Signed)
This note was copied from a baby's chart. Lactation Consultation Note  Patient Name: Jackie [name] Date: 05/30/2019 Reason for consult: Initial assessment;Other (Comment);Primapara;1st time breastfeeding;Maternal endocrine disorder(AMA) Type of Endocrine Disorder?: Diabetes(GDM (diet))  8 hours old FT female who is being exclusively BF by her mother, she's a P1. Mom reported (+) breast changes during the pregnancy but she wasn't very familiar with hand expression. LC showed mom how to hand express and she was able to do teach back, several droplets of colostrum noted out of both breasts. LC showed parents how to finger feed baby, she was in her bassinet. She has a Spectra DEBP that she brought to the hospital.  Mom had GDM (diet) and baby's serum glucose have been at 38 and the last one at 45. Mom just finished nursing baby when entering the room, offered assistance with latch, but RN Charlena Cross was still working with dad and baby teaching him how to swaddle. Mom willing to take baby to breast again as soon as dad was ready with baby but she got sick and vomited all her food. RN Ebony assisted mom and [place] asked her to call for assistance whenever is needed, mom not ready to feed baby again at this point. Reviewed normal newborn behavior, feeding cues and cluster feeding.  Feeding plan:  1. Encouraged mom to feed baby STS 8-12 times/24 hours or sooner if feeding cues are present 2. Hand expression and finger/spoon feeding were also encouraged  BF brochure, BF resources and feeding diary were reviewed. Parents asked to be followed up by lactation tomorrow. They reported all questions and concerns were answered, they're both aware of [place] OP services and will call PRN.  Maternal Data Formula Feeding for Exclusion: No Has patient been taught Hand Expression?: Yes Does the patient have breastfeeding experience prior to this delivery?: No  Feeding Feeding Type: Breast Fed  LATCH  Score Latch: Grasps breast easily, tongue down, lips flanged, rhythmical sucking.  Audible Swallowing: A few with stimulation  Type of Nipple: Everted at rest and after stimulation  Comfort (Breast/Nipple): Soft / non-tender  Hold (Positioning): Assistance needed to correctly position infant at breast and maintain latch.  LATCH Score: 8  Interventions Interventions: Breast feeding basics reviewed;Breast massage;Hand express;Breast compression  Lactation Tools Discussed/Used WIC Program: No   Consult Status Consult Status: Follow-up Date: 05/31/19 Follow-up type: In-patient    Jackie [name] 05/30/2019, 5:57 PM

## 2019-05-31 ENCOUNTER — Encounter (HOSPITAL_COMMUNITY): Payer: Self-pay | Admitting: Obstetrics

## 2019-05-31 LAB — GLUCOSE, CAPILLARY: Glucose-Capillary: 94 mg/dL (ref 70–99)

## 2019-05-31 LAB — CBC
HCT: 31.1 % — ABNORMAL LOW (ref 36.0–46.0)
Hemoglobin: 10.4 g/dL — ABNORMAL LOW (ref 12.0–15.0)
MCH: 32.6 pg (ref 26.0–34.0)
MCHC: 33.4 g/dL (ref 30.0–36.0)
MCV: 97.5 fL (ref 80.0–100.0)
Platelets: 225 10*3/uL (ref 150–400)
RBC: 3.19 MIL/uL — ABNORMAL LOW (ref 3.87–5.11)
RDW: 14.5 % (ref 11.5–15.5)
WBC: 24.3 10*3/uL — ABNORMAL HIGH (ref 4.0–10.5)
nRBC: 0 % (ref 0.0–0.2)

## 2019-05-31 MED ORDER — IBUPROFEN 800 MG PO TABS
800.0000 mg | ORAL_TABLET | Freq: Four times a day (QID) | ORAL | Status: DC
  Administered 2019-05-31 – 2019-06-02 (×8): 800 mg via ORAL
  Filled 2019-05-31 (×8): qty 1

## 2019-05-31 MED ORDER — POLYSACCHARIDE IRON COMPLEX 150 MG PO CAPS
150.0000 mg | ORAL_CAPSULE | Freq: Every day | ORAL | Status: DC
  Administered 2019-06-02: 150 mg via ORAL
  Filled 2019-05-31 (×2): qty 1

## 2019-05-31 MED ORDER — MAGNESIUM OXIDE 400 (241.3 MG) MG PO TABS
400.0000 mg | ORAL_TABLET | Freq: Every day | ORAL | Status: DC
  Administered 2019-06-01 – 2019-06-02 (×2): 400 mg via ORAL
  Filled 2019-05-31 (×2): qty 1

## 2019-05-31 NOTE — Progress Notes (Signed)
POSTOPERATIVE DAY # 1 S/P Primary LTCS for arrest of descent/persistent OP, baby girl "[place]"   S:         Reports feeling tired - did not sleep much last night              Tolerating po intake / (+) nausea and vomiting yesterday, but none today; hasn't eaten since / no flatus / no BM  Denies dizziness, SOB, or CP             Bleeding is moderate             Pain controlled with Motrin and Tylenol             Up ad lib / ambulatory/ void x 1 since foley removal without difficulty   Newborn breast feeding - going well, baby cluster feeding  Requesting lab value information and anxious over numbers,. Requesting repeat labs in AM   O:  VS: BP (!) 91/57 (BP Location: Right Arm)   Pulse (!) 58   Temp 97.6 F (36.4 C) (Oral)   Resp 20   Ht 5\' 8"  (1.727 m)   Wt 95.6 kg   LMP 08/21/2018 (Exact Date)   SpO2 100%   Breastfeeding Unknown   BMI 32.05 kg/m    LABS:               Recent Labs    05/29/19 0059 05/31/19 0514  WBC 9.0 24.3*  HGB 12.8 10.4*  PLT 227 225               Bloodtype: --/--/A POS, A POS Performed at [facility], [address] [place], [place]  [phone] (37/10)  Rubella: 2.88 (05/04 1355)                                             I&O: Intake/Output      11/20 0701 - 11/21 0700 11/21 0701 - 11/22 0700   I.V. (mL/kg) 2100 (22)    Other 0    Total Intake(mL/kg) 2100 (22)    Urine (mL/kg/hr) 4300 (1.9)    Blood 472    Total Output 4772    Net -2672                      Physical Exam:             Alert and Oriented X3  Lungs: Clear and unlabored  Heart: regular rate and rhythm / no murmurs  Abdomen: soft, non-tender, moderate gaseous distention, active bowel sounds in all quadrants             Fundus: firm, non-tender, U-1             Dressing: honeycomb with steri-strips, old drainage marked on left side of dressing, otherwise dry and intact              Incision:  approximated with sutures / no erythema / no ecchymosis / no active  drainage  Perineum: intact  Lochia: small rubra on pad   Extremities: +1 LE edema, no calf pain or tenderness, SCDs on   A/P:     POD # 1 S/P Primary LTCS            ABL Anemia   - being Niferex 150mg  PO daily tomorrow when bowel function improves   -  Magnesium oxide 400mg  PO daily   Leukocytosis   - Afebrile, no s/s of infection    - Repeat CBCD tomorrow AM   - reassurance provided   A1GDM   - plan to f/u PP  Routine postoperative care              Warm liquids and ambulation to promote bowel motility   See lactation today  Ambulation encouraged   Continue current care   Lars Pinks, MSN, CNM [facility]

## 2019-05-31 NOTE — Lactation Note (Signed)
This note was copied from a baby's chart. Lactation Consultation Note  Patient Name: Jackie [name] [id number] Date: 05/31/2019 Reason for consult: Term;Primapara;1st time breastfeeding Type of Endocrine Disorder?: Diabetes  P1 mother whose infant is now 35 hours old.    Baby was asleep in mother's arms when I arrived.  Mother had just awakened from a nap.  Since it had been four hours since baby last fed I suggested father begin to awaken her.  Offered to assist with latching and mother agreeable.  Father was a big help in assisting mother to get positioned properly and to help adjust her pillows for breast feeding.  Mother wanted to try latching baby without my assistance and I willingly allowed her to do this.  Mother's breasts are soft and non tender and nipples are everted and intact.  Mother states sensitivity to nipples and has coconut oil at bedside.    Mother began using the cross cradle position and was able to latch baby.  She then immediately switched to the cradle hold.  Cautioned about watching baby's latch in the cradle hold and to be sure baby continues to suck effectively and does not slip down onto the nipple.  Mother verbalized understanding.  Demonstrated breast compressions during feeding to help milk flow.  Reviewed breast feeding basics while observing baby feed for 15 minutes.  She required a lot of stimulation to continue to feed, but, with gentle stimulation responded well.  Parents stated that baby "cluster fed" last night.  She has been very sleepy today.  Encouraged to continue feeding 8-12 times/24 hours or sooner if baby shows cues.  Mother is familiar with hand expression and feeding cues.  She will call her RN/LC for latch assistance as needed.  Father very supportive and willing to help.  Mother has a DEBP for home use and has brought it to the hospital.  Discussed how staff is not able to demonstrate personal pumps but reassured her that most are quite simple  to set up.  She is planning to research online about her pump for directions if she has difficulty.  Mother requested a manual pump.  Pump provided with instructions for use.  #24 flange is appropriate at this time.      Maternal Data Formula Feeding for Exclusion: No Has patient been taught Hand Expression?: Yes Does the patient have breastfeeding experience prior to this delivery?: No  Feeding Feeding Type: Breast Fed  LATCH Score Latch: Grasps breast easily, tongue down, lips flanged, rhythmical sucking.  Audible Swallowing: A few with stimulation  Type of Nipple: Everted at rest and after stimulation  Comfort (Breast/Nipple): Filling, red/small blisters or bruises, mild/mod discomfort  Hold (Positioning): Assistance needed to correctly position infant at breast and maintain latch.  LATCH Score: 7  Interventions Interventions: Breast feeding basics reviewed;Assisted with latch;Skin to skin;Breast massage;Hand express;Breast compression;Hand pump;Coconut oil;Position options;Support pillows  Lactation Tools Discussed/Used Tools: Pump Breast pump type: Manual WIC Program: No Pump Review: Setup, frequency, and cleaning;Milk Storage Initiated by:: Paul Dykes Date initiated:: 05/31/19   Consult Status Consult Status: Follow-up Date: 06/01/19 Follow-up type: In-patient    Little Ishikawa 05/31/2019, 2:38 PM

## 2019-06-01 LAB — CBC WITH DIFFERENTIAL/PLATELET
Abs Immature Granulocytes: 0.07 10*3/uL (ref 0.00–0.07)
Basophils Absolute: 0 10*3/uL (ref 0.0–0.1)
Basophils Relative: 0 %
Eosinophils Absolute: 0.1 10*3/uL (ref 0.0–0.5)
Eosinophils Relative: 1 %
HCT: 26.5 % — ABNORMAL LOW (ref 36.0–46.0)
Hemoglobin: 9 g/dL — ABNORMAL LOW (ref 12.0–15.0)
Immature Granulocytes: 1 %
Lymphocytes Relative: 14 %
Lymphs Abs: 1.6 10*3/uL (ref 0.7–4.0)
MCH: 32.4 pg (ref 26.0–34.0)
MCHC: 34 g/dL (ref 30.0–36.0)
MCV: 95.3 fL (ref 80.0–100.0)
Monocytes Absolute: 0.8 10*3/uL (ref 0.1–1.0)
Monocytes Relative: 7 %
Neutro Abs: 8.7 10*3/uL — ABNORMAL HIGH (ref 1.7–7.7)
Neutrophils Relative %: 77 %
Platelets: 210 10*3/uL (ref 150–400)
RBC: 2.78 MIL/uL — ABNORMAL LOW (ref 3.87–5.11)
RDW: 14.5 % (ref 11.5–15.5)
WBC: 11.4 10*3/uL — ABNORMAL HIGH (ref 4.0–10.5)
nRBC: 0 % (ref 0.0–0.2)

## 2019-06-01 LAB — URINALYSIS, ROUTINE W REFLEX MICROSCOPIC
Bacteria, UA: NONE SEEN
Bilirubin Urine: NEGATIVE
Glucose, UA: NEGATIVE mg/dL
Ketones, ur: NEGATIVE mg/dL
Nitrite: NEGATIVE
Protein, ur: NEGATIVE mg/dL
Specific Gravity, Urine: 1.005 (ref 1.005–1.030)
pH: 5 (ref 5.0–8.0)

## 2019-06-01 MED ORDER — PHENAZOPYRIDINE HCL 100 MG PO TABS
100.0000 mg | ORAL_TABLET | Freq: Three times a day (TID) | ORAL | Status: DC
  Filled 2019-06-01 (×4): qty 1

## 2019-06-01 NOTE — Progress Notes (Signed)
POSTOPERATIVE DAY # 2 S/P Primary LTCS for arrest of descent/persistent OP, baby girl "[place]"   S:       Tired, in pain (not taken any Oxycodone yet), tearful as baby has lost weight and not milk not coming in.  Tolerating regular diet. But constipation and hemorrhoids causing problem and pain.  C/o dysuria and pain with urination. Was having voiding dysfunction yesterday but is better today, can feel fullness and desire to void sensation.   O:  VS: BP 92/60 (BP Location: Right Arm)   Pulse 68   Temp 98.6 F (37 C) (Oral)   Resp 18   Ht 5\' 8"  (1.727 m)   Wt 95.6 kg   LMP 08/21/2018 (Exact Date)   SpO2 98%   Breastfeeding Unknown   BMI 32.05 kg/m    LABS:     CBC Latest Ref Rng & Units 06/01/2019 05/31/2019 05/29/2019  WBC 4.0 - 10.5 K/uL 11.4(H) 24.3(H) 9.0  Hemoglobin 12.0 - 15.0 g/dL 9.0(L) 10.4(L) 12.8  Hematocrit 36.0 - 46.0 % 26.5(L) 31.1(L) 38.5  Platelets 150 - 400 K/uL 210 225 227               Bloodtype: --/--/A POS, A POS Performed at [facility], [address], [place], [place]  [phone] (95/18)  Rubella: 2.88 (05/04 1355)                                Physical Exam:             Alert and Oriented X3  Lungs: Clear and unlabored  Heart: regular rate and rhythm / no murmurs  Abdomen: soft, non-tender, moderate gaseous distention, active bowel sounds in all quadrants             Fundus: firm, non-tender, U-1             Dressing: honeycomb with steri-strips, old drainage marked on left side of dressing, otherwise dry and intact              Incision:  approximated with sutures / no erythema / no ecchymosis / no active drainage  Perineum: intact  Lochia: small rubra on pad   Extremities: +1 LE edema, no calf pain or tenderness, SCDs on   A/P:     POD # 2 S/P Primary LTCS for arrest of descent.             Exhausted and in pain- reviewed adding Oxycodone, baby safety reviewed and pain control important now-- no prior anxiety hx but has had some since  pregnancy and worse since needing Csection and now with breast feeding difficulty and baby's wt loss.  Reviewed this is normal for new mom and we need to work on pain management and support baby with formula but continue breast feeding and also may add breast pump- electric to stimulate breasts.             Constipation mngmt- Prune juice/ apple juice warm            ABL Anemia- Iron once eating better and bowels better.            WBC improved, pt reassured            Dysuria- UA, culture today. Hydrate well.  Closely monitor Recc Discharge tomorrow but close f/up in office in 7-10 days to assess mood/ anxiety and breast feeding and post  op pain and hemorrhoids.   --V.Alicia Ackert, MD

## 2019-06-01 NOTE — Progress Notes (Signed)
Pharmacy called regarding order for pyridium with information to share with pt regarding it possibly tinging urine and EBM orange, as well as avoiding contact lenses if pt wear those. Information shared with pt. Pt reports decrease in pain level with addition of oxycodone and wishes to hold off on taking pyridium for now.

## 2019-06-02 LAB — URINE CULTURE: Culture: NO GROWTH

## 2019-06-02 MED ORDER — OXYCODONE HCL 5 MG PO TABS: 5.0000 mg | ORAL_TABLET | ORAL | 0 refills | Status: AC | PRN

## 2019-06-02 MED ORDER — IBUPROFEN 800 MG PO TABS: 800.0000 mg | ORAL_TABLET | Freq: Four times a day (QID) | ORAL | 6 refills | Status: DC

## 2019-06-02 MED ORDER — POLYSACCHARIDE IRON COMPLEX 150 MG PO CAPS: 150.0000 mg | ORAL_CAPSULE | Freq: Every day | ORAL | 6 refills | Status: DC

## 2019-06-02 NOTE — Lactation Note (Addendum)
This note was copied from a baby's chart. Lactation Consultation Note  Patient Name: Jackie [name] ENIDP'O Date: 06/02/2019 Reason for consult: Follow-up assessment;Infant weight loss P1, 62 hour female infant, weight loss -9%. Mom with hx of : AMA, C/S and GDM. Tools: mom has comfort gels, coconut oil, hand pump and her own DEBP ( Spectra 2). Per mom, she feel infant is latching well now and infant had finished breast feeding 20 minutes prior to [facility] entered the room.  Infant was supplemented and pace fed 18 mls of Similac with iron 20 kcal using slow flow bottle nipple. Mom was pumping her right breast using her personal  Spectra DEBP and had 42ml of EBM and was still pumping when [place] left room. Mom is letting right breast heal and plans to latch infant again on breast mom mostly been latching infant on left breast due to soreness with right breast. Mom knows to call Nurse or [place] if she has any questions, concerns or need assistance with latching infant at breast.  Mom's plan: 1. Mom will latch infant at breast according to hunger cues. 2. Mom will give infant back EBM mixed with formula according infant age/ hours of life. 3. Mom will use her Spectra DEBP afterwards or every 3 hours on initial setting.   Maternal Data    Feeding Feeding Type: Breast Fed Nipple Type: Slow - flow  LATCH Score Latch: Repeated attempts needed to sustain latch, nipple held in mouth throughout feeding, stimulation needed to elicit sucking reflex.  Audible Swallowing: Spontaneous and intermittent  Type of Nipple: Everted at rest and after stimulation  Comfort (Breast/Nipple): Filling, red/small blisters or bruises, mild/mod discomfort  Hold (Positioning): Assistance needed to correctly position infant at breast and maintain latch.  LATCH Score: 7  Interventions Interventions: DEBP(Mom is using her Spectra 2 DEBP)  Lactation Tools Discussed/Used     Consult Status Consult Status:  Follow-up Date: 06/02/19 Follow-up type: In-patient    Vicente Serene 06/02/2019, 12:07 AM

## 2019-06-02 NOTE — Progress Notes (Signed)
SVD: primary  S:  Pt reports feeling well/ Tolerating po/ Voiding without problems/ No n/v/ Bleeding is moderate/ Pain controlled withacetaminophen and narcotic analgesics including oxycodone Did not take pyridium for urinary pain   O:  A & O x 3  / VS: Blood pressure 113/79, pulse 68, temperature 97.8 F (36.6 C), temperature source Oral, resp. rate 18, height 5\' 8"  (1.727 m), weight 95.6 kg, last menstrual period 08/21/2018, SpO2 98 %, unknown if currently breastfeeding.  LABS:  Results for orders placed or performed during the hospital encounter of 05/29/19 (from the past 24 hour(s))  Urinalysis, Routine w reflex microscopic     Status: Abnormal   Collection Time: 06/01/19  4:19 PM  Result Value Ref Range   Color, Urine YELLOW YELLOW   APPearance HAZY (A) CLEAR   Specific Gravity, Urine 1.005 1.005 - 1.030   pH 5.0 5.0 - 8.0   Glucose, UA NEGATIVE NEGATIVE mg/dL   Hgb urine dipstick LARGE (A) NEGATIVE   Bilirubin Urine NEGATIVE NEGATIVE   Ketones, ur NEGATIVE NEGATIVE mg/dL   Protein, ur NEGATIVE NEGATIVE mg/dL   Nitrite NEGATIVE NEGATIVE   Leukocytes,Ua SMALL (A) NEGATIVE   RBC / HPF 6-10 0 - 5 RBC/hpf   WBC, UA 6-10 0 - 5 WBC/hpf   Bacteria, UA NONE SEEN NONE SEEN   Squamous Epithelial / LPF 0-5 0 - 5    I&O: No intake/output data recorded.   No intake/output data recorded.  Lungs: chest clear, no wheezing, rales, normal symmetric air entry  Heart: regular rate and rhythm, S1, S2 normal, no murmur, click, rub or gallop  Abdomen: soft uterus 2FB below umb surgical tender.  Primary dressing   Perineum: is normal  Lochia: mod  Extremities:edema 1+  Primary dressing intact dry brown stain outlined on the left  A/P: POD # 3/PPD # 3/ G1P1001 GDM Class A1  Doing well  Continue routine post partum orders  D/c instructions reviewed  script for oxycodone, motrin sent Informed pt need 2hr GTT 8-12 wk postpartum  WOB pp booklet given Instructed to self remove honeycomb  dressing on wednesday

## 2019-06-02 NOTE — Discharge Instructions (Signed)
Call if temperature greater than equal to 100.4, nothing per vagina for 4-6 weeks or severe nausea vomiting, increased incisional pain , drainage or redness in the incision site, no straining with bowel movements, showers no bath °

## 2019-06-02 NOTE — Discharge Summary (Signed)
Physician Discharge Summary  Patient ID: Jackie [name] MRN: [id number] DOB/AGE: 10-17-1985 33 y.o.  Admit date: 05/29/2019 Discharge date: 06/02/2019  Admission Diagnoses: Class a1 GDM, postterm  Discharge Diagnoses: Arrest of descent, Class a1 GDM, post term Principal Problem:   Postpartum care following cesarean delivery (11/20) Active Problems:   Encounter for planned induction of labor   Arrest of descent, delivered, current hospitalization   Discharged Condition: stable  Hospital Course: admitted for IOL. cytotec x 2 done followed by intracervical balloon and IV pitocin. Progressed to full dilation. Pushed for several hours. Arrest of descent. Primary C/S. See op note. Postop course notable for complaint of  Dysuria. ucx pending  Consults: None  Significant Diagnostic Studies: labs:  CBC Latest Ref Rng & Units 06/01/2019 05/31/2019 05/29/2019  WBC 4.0 - 10.5 K/uL 11.4(H) 24.3(H) 9.0  Hemoglobin 12.0 - 15.0 g/dL 9.0(L) 10.4(L) 12.8  Hematocrit 36.0 - 46.0 % 26.5(L) 31.1(L) 38.5  Platelets 150 - 400 K/uL 210 225 227  ucx pending  Treatments: surgery:  Primary cesarean section, kerr hyst  Discharge Exam: Blood pressure 113/79, pulse 68, temperature 97.8 F (36.6 C), temperature source Oral, resp. rate 18, height 5\' 8"  (1.727 m), weight 95.6 kg, last menstrual period 08/21/2018, SpO2 98 %, unknown if currently breastfeeding. General appearance: alert, cooperative and no distress Resp: clear to auscultation bilaterally Breasts: normal appearance, no masses or tenderness Cardio: regular rate and rhythm, S1, S2 normal, no murmur, click, rub or gallop GI: soft uterus 2FB below umb, primary dressing intact brown stained Extremities: edema 1+ Incision/Wound:  Disposition: Discharge disposition: 01-Home or Self Care       Discharge Instructions    Activity as tolerated   Complete by: As directed    Ambulatory referral to Lactation   Complete by: As directed    Reason for consult: The Mother-Infant Dyad Needs Assistance in the Continuation of Breastfeeding   Call MD for:   Complete by: As directed    Soaking [name] regular maxi pad every hour or more frequently, passing large clots  For C-section:  Call if incision red, draining or increased pain. Passing clots, nausea, vomiting , severe abdominal pain   Call MD for:  temperature >100.4   Complete by: As directed    Diet general   Complete by: As directed    Discharge instructions   Complete by: As directed    For C-section: no heavy lifting(>25 lbs ) for two weeks, No driving for two weeks.   Driving restriction    Complete by: As directed    Avoid driving for at least two weeks(for C-section).   Lifting restrictions   Complete by: As directed    Weight restriction of 25 lbs.        Signed: Andoni [name] [name] Jackie [name] 06/02/2019, 11:34 AM

## 2019-06-02 NOTE — Lactation Note (Signed)
This note was copied from a baby's chart. Lactation Consultation Note  Patient Name: Jackie [name] OVZCH'Y Date: 06/02/2019   Baby 79 hours old.  Weight increased.  Mother is supplemented with breastmilk and formula. Mother's has cracked L nipple, R nipple abrasion. Discussed APNO for soreness. Mother also has comfort gels. Mother has personal DEBP and is post pumping.  Feed on demand with cues.  Goal 8-12+ times per day after first 24 hrs.  Place baby STS if not cueing.  Reviewed engorgement care and monitoring voids/stools.       Maternal Data    Feeding    LATCH Score                   Interventions    Lactation Tools Discussed/Used     Consult Status      Vivianne Master [facility] 06/02/2019, 11:44 AM

## 2021-01-18 LAB — OB RESULTS CONSOLE RPR: RPR: NONREACTIVE

## 2021-01-18 LAB — OB RESULTS CONSOLE GC/CHLAMYDIA
Chlamydia: NEGATIVE
Gonorrhea: NEGATIVE

## 2021-01-18 LAB — OB RESULTS CONSOLE ABO/RH: RH Type: POSITIVE

## 2021-01-18 LAB — OB RESULTS CONSOLE ANTIBODY SCREEN: Antibody Screen: NEGATIVE

## 2021-01-18 LAB — OB RESULTS CONSOLE HEPATITIS B SURFACE ANTIGEN: Hepatitis B Surface Ag: NEGATIVE

## 2021-01-18 LAB — OB RESULTS CONSOLE RUBELLA ANTIBODY, IGM: Rubella: IMMUNE

## 2021-01-18 LAB — OB RESULTS CONSOLE HIV ANTIBODY (ROUTINE TESTING): HIV: NONREACTIVE

## 2021-08-19 ENCOUNTER — Other Ambulatory Visit: Payer: Self-pay | Admitting: Obstetrics

## 2021-08-22 LAB — OB RESULTS CONSOLE GBS: GBS: NEGATIVE

## 2021-09-01 ENCOUNTER — Encounter (HOSPITAL_COMMUNITY): Payer: Self-pay | Admitting: *Deleted

## 2021-09-01 NOTE — Patient Instructions (Signed)
Azucena Fallen ? 09/01/2021 ? ? Your procedure is scheduled on:  09/15/2021 ? Arrive at 0800 at [facility] on [address] at [facility]  and [employer]. You are invited to use the FREE valet parking or use the Visitor's parking deck. ? Pick up the phone at the desk and dial [phone]. ? Call this number if you have problems the morning of surgery: [phone] ? Remember: ? ? Do not eat food:(After Midnight) Desp?s de medianoche. ? Do not drink clear liquids: (After Midnight) Desp?s de medianoche. ? Take these medicines the morning of surgery with A SIP OF WATER:  none ? ? Do not wear jewelry, make-up or nail polish. ? Do not wear lotions, powders, or perfumes. Do not wear deodorant. ? Do not shave 48 hours prior to surgery. ? Do not bring valuables to the hospital.  [facility] is not  ? responsible for any belongings or valuables brought to the hospital. ? Contacts, dentures or bridgework may not be worn into surgery. ? Leave suitcase in the car. After surgery it may be brought to your room. ? For patients admitted to the hospital, checkout time is 11:00 AM the day of  ?            discharge. ? ?   ? Please read over the following fact sheets that you were given:  ?   Preparing for Surgery ? ? ?

## 2021-09-02 ENCOUNTER — Encounter (HOSPITAL_COMMUNITY): Payer: Self-pay

## 2021-09-13 ENCOUNTER — Other Ambulatory Visit: Payer: Self-pay | Admitting: Obstetrics

## 2021-09-13 ENCOUNTER — Encounter (HOSPITAL_COMMUNITY): Payer: Self-pay | Admitting: Obstetrics

## 2021-09-13 ENCOUNTER — Other Ambulatory Visit: Payer: Self-pay

## 2021-09-13 ENCOUNTER — Other Ambulatory Visit (HOSPITAL_COMMUNITY)
Admission: RE | Admit: 2021-09-13 | Discharge: 2021-09-13 | Disposition: A | Discharge status: Home / Self Care | Payer: PRIVATE HEALTH INSURANCE | Source: Ambulatory Visit | Attending: Obstetrics | Admitting: Obstetrics

## 2021-09-13 DIAGNOSIS — Z01812 Encounter for preprocedural laboratory examination: Secondary | ICD-10-CM | POA: Insufficient documentation

## 2021-09-13 LAB — TYPE AND SCREEN
ABO/RH(D): A POS
Antibody Screen: NEGATIVE

## 2021-09-13 LAB — CBC
HCT: 38.1 % (ref 36.0–46.0)
Hemoglobin: 13 g/dL (ref 12.0–15.0)
MCH: 32 pg (ref 26.0–34.0)
MCHC: 34.1 g/dL (ref 30.0–36.0)
MCV: 93.8 fL (ref 80.0–100.0)
Platelets: 218 10*3/uL (ref 150–400)
RBC: 4.06 MIL/uL (ref 3.87–5.11)
RDW: 13.4 % (ref 11.5–15.5)
WBC: 8.3 10*3/uL (ref 4.0–10.5)
nRBC: 0 % (ref 0.0–0.2)

## 2021-09-13 LAB — SARS CORONAVIRUS 2 (TAT 6-24 HRS): SARS Coronavirus 2: NEGATIVE

## 2021-09-13 LAB — RPR: RPR Ser Ql: NONREACTIVE

## 2021-09-14 NOTE — H&P (Signed)
Jackie [name] is a 35 y.o. G2P1001 at [redacted]w[redacted]d presenting for RCS. Pt notes occasional contractions. Good fetal movement, No vaginal bleeding, not leaking fluid. ? ?PNCare at [employer] since 8 wks ?- Dated by LMP c/w 8 wk u/s ?GERD ?LGA, no GDM, 36 wks AC 99%, 8'2/ 79% ? ? ?Prenatal Transfer Tool  ?Maternal Diabetes: No ?Genetic Screening: Normal ?Maternal Ultrasounds/Referrals: Normal ?Fetal Ultrasounds or other Referrals:  None ?Maternal Substance Abuse:  No ?Significant Maternal Medications:  None ?Significant Maternal Lab Results: Group B Strep negative ? ? ? ? ?OB History   ? ? Gravida  ?2  ? Para  ?1  ? Term  ?1  ? Preterm  ?   ? AB  ?   ? Living  ?1  ?  ? ? SAB  ?   ? IAB  ?   ? Ectopic  ?   ? Multiple  ?0  ? Live Births  ?1  ?   ?  ?  ? ?Past Medical History:  ?Diagnosis Date  ? ADHD   ? Gestational diabetes   ? Hemorrhoids   ? HSV (herpes simplex virus) infection   ? ?Past Surgical History:  ?Procedure Laterality Date  ? CESAREAN SECTION N/A 05/30/2019  ? Procedure: CESAREAN SECTION;  Surgeon: Aloha Gell, MD;  Location: [facility];  Service: Obstetrics;  Laterality: N/A;  ? ?Family History: family history includes COPD in her maternal grandfather and maternal grandmother; Diabetes in her maternal grandfather and mother; Hypertension in her father and mother. ?Social History:  reports that she has never smoked. She has never used smokeless tobacco. She reports that she does not currently use alcohol. She reports current drug use. ? ?Review of Systems - Negative except slight anxiety, discomfort of pregnancy ? ? ?Physical Exam:  ?Vitals:  ? 09/15/21 0824  ?BP: 127/80  ?Pulse: 87  ?Resp: 16  ?Temp: 97.6 ?F (36.4 ?C)  ?SpO2: 100%  ? ? ?Gen: well appearing, no distress ? ?Back: no CVAT ?Abd: gravid, NT, no RUQ pain ?LE: trace edema, equal bilaterally, non-tender variability ? ?Prenatal labs: ?ABO, Rh: --/--/A POS (03/07 [id number]) ?Antibody: NEG (03/07 [id number]) ?Rubella: Immune (07/12 0000) ?RPR: NON  REACTIVE (03/07 0950)  ?HBsAg: Negative (07/12 0000)  ?HIV: Non-reactive (07/12 0000)  ?GBS: Negative/-- (02/13 0000)  ?1 hr Glucola normal  ?Genetic screening nl Panorama, nl AFP ?Anatomy US normal ? ?CBC ?   ?Component Value Date/Time  ? WBC 8.3 09/13/2021 0950  ? RBC 4.06 09/13/2021 0950  ? HGB 13.0 09/13/2021 0950  ? HGB 12.9 03/06/2019 0921  ? HCT 38.1 09/13/2021 0950  ? HCT 37.6 03/06/2019 0921  ? PLT 218 09/13/2021 0950  ? PLT 266 03/06/2019 0921  ? MCV 93.8 09/13/2021 0950  ? MCV 96 03/06/2019 0921  ? MCH 32.0 09/13/2021 0950  ? MCHC 34.1 09/13/2021 0950  ? RDW 13.4 09/13/2021 0950  ? RDW 11.8 03/06/2019 0921  ? LYMPHSABS 1.6 06/01/2019 0540  ? LYMPHSABS 1.7 11/11/2018 1355  ? MONOABS 0.8 06/01/2019 0540  ? EOSABS 0.1 06/01/2019 0540  ? EOSABS 0.1 11/11/2018 1355  ? BASOSABS 0.0 06/01/2019 0540  ? BASOSABS 0.0 11/11/2018 1355  ? ? ? ?Assessment/Plan: 36 y.o. G2P1001 at [redacted]w[redacted]d ?- Prior c/s for arrest of  descent, Plan RCS ?- LGA  ? ?[name] ?09/14/2021, 8:33 PM ? ?[name] ?09/15/2021 10:24 AM  ? ? ?

## 2021-09-15 ENCOUNTER — Inpatient Hospital Stay (HOSPITAL_COMMUNITY): Payer: PRIVATE HEALTH INSURANCE | Admitting: Anesthesiology

## 2021-09-15 ENCOUNTER — Encounter (HOSPITAL_COMMUNITY)
Admission: RE | Disposition: A | Discharge status: Home / Self Care | Payer: Self-pay | Source: Home / Self Care | Attending: Obstetrics

## 2021-09-15 ENCOUNTER — Encounter (HOSPITAL_COMMUNITY): Payer: Self-pay | Admitting: Obstetrics

## 2021-09-15 ENCOUNTER — Other Ambulatory Visit: Payer: Self-pay

## 2021-09-15 ENCOUNTER — Inpatient Hospital Stay (HOSPITAL_COMMUNITY)
Admission: RE | Admit: 2021-09-15 | Discharge: 2021-09-17 | DRG: 787 | Disposition: A | Discharge status: Home / Self Care | Payer: PRIVATE HEALTH INSURANCE | Attending: Obstetrics | Admitting: Obstetrics

## 2021-09-15 DIAGNOSIS — O34211 Maternal care for low transverse scar from previous cesarean delivery: Secondary | ICD-10-CM | POA: Diagnosis present

## 2021-09-15 DIAGNOSIS — Z3A39 39 weeks gestation of pregnancy: Secondary | ICD-10-CM

## 2021-09-15 DIAGNOSIS — O9081 Anemia of the puerperium: Secondary | ICD-10-CM | POA: Diagnosis not present

## 2021-09-15 DIAGNOSIS — D62 Acute posthemorrhagic anemia: Secondary | ICD-10-CM | POA: Diagnosis not present

## 2021-09-15 DIAGNOSIS — O24429 Gestational diabetes mellitus in childbirth, unspecified control: Secondary | ICD-10-CM

## 2021-09-15 DIAGNOSIS — Z98891 History of uterine scar from previous surgery: Principal | ICD-10-CM

## 2021-09-15 DIAGNOSIS — O3663X0 Maternal care for excessive fetal growth, third trimester, not applicable or unspecified: Secondary | ICD-10-CM | POA: Diagnosis present

## 2021-09-15 HISTORY — DX: Herpesviral infection, unspecified: B00.9

## 2021-09-15 HISTORY — DX: Gestational diabetes mellitus in pregnancy, unspecified control: O24.419

## 2021-09-15 HISTORY — DX: Unspecified hemorrhoids: K64.9

## 2021-09-15 SURGERY — Surgical Case
Anesthesia: Spinal

## 2021-09-15 MED ORDER — PHENYLEPHRINE 40 MCG/ML (10ML) SYRINGE FOR IV PUSH (FOR BLOOD PRESSURE SUPPORT)
PREFILLED_SYRINGE | INTRAVENOUS | Status: AC
  Filled 2021-09-15: qty 10

## 2021-09-15 MED ORDER — MEPERIDINE HCL 25 MG/ML IJ SOLN: 6.2500 mg | INTRAMUSCULAR | Status: DC | PRN

## 2021-09-15 MED ORDER — PHENYLEPHRINE HCL-NACL 20-0.9 MG/250ML-% IV SOLN
INTRAVENOUS | Status: DC | PRN
Start: 2021-09-15 — End: 2021-09-15
  Administered 2021-09-15: 12:00:00 40 ug/min via INTRAVENOUS
  Administered 2021-09-15: 11:00:00 60 ug/min via INTRAVENOUS

## 2021-09-15 MED ORDER — LACTATED RINGERS IV SOLN: INTRAVENOUS | Status: DC | PRN

## 2021-09-15 MED ORDER — DIPHENHYDRAMINE HCL 25 MG PO CAPS: 25.0000 mg | ORAL_CAPSULE | ORAL | Status: DC | PRN

## 2021-09-15 MED ORDER — MENTHOL 3 MG MT LOZG: 1.0000 | LOZENGE | OROMUCOSAL | Status: DC | PRN

## 2021-09-15 MED ORDER — PRENATAL MULTIVITAMIN CH
1.0000 | ORAL_TABLET | Freq: Every day | ORAL | Status: DC
  Administered 2021-09-15 – 2021-09-17 (×3): 1 via ORAL
  Filled 2021-09-15 (×3): qty 1

## 2021-09-15 MED ORDER — OXYCODONE HCL 5 MG/5ML PO SOLN: 5.0000 mg | Freq: Once | ORAL | Status: DC | PRN

## 2021-09-15 MED ORDER — TETANUS-DIPHTH-ACELL PERTUSSIS 5-2.5-18.5 LF-MCG/0.5 IM SUSY: 0.5000 mL | PREFILLED_SYRINGE | Freq: Once | INTRAMUSCULAR | Status: DC

## 2021-09-15 MED ORDER — ONDANSETRON HCL 4 MG/2ML IJ SOLN
INTRAMUSCULAR | Status: DC | PRN
  Administered 2021-09-15: 11:00:00 4 mg via INTRAVENOUS

## 2021-09-15 MED ORDER — METOCLOPRAMIDE HCL 5 MG/ML IJ SOLN
INTRAMUSCULAR | Status: DC | PRN
  Administered 2021-09-15: 12:00:00 10 mg via INTRAVENOUS

## 2021-09-15 MED ORDER — OXYTOCIN-SODIUM CHLORIDE 30-0.9 UT/500ML-% IV SOLN
INTRAVENOUS | Status: AC
Start: 2021-09-15 — End: ?
  Filled 2021-09-15: qty 500

## 2021-09-15 MED ORDER — DIPHENHYDRAMINE HCL 50 MG/ML IJ SOLN: 12.5000 mg | INTRAMUSCULAR | Status: DC | PRN

## 2021-09-15 MED ORDER — SIMETHICONE 80 MG PO CHEW: 80.0000 mg | CHEWABLE_TABLET | ORAL | Status: DC | PRN

## 2021-09-15 MED ORDER — NALOXONE HCL 0.4 MG/ML IJ SOLN: 0.4000 mg | INTRAMUSCULAR | Status: DC | PRN

## 2021-09-15 MED ORDER — CEFAZOLIN SODIUM-DEXTROSE 2-4 GM/100ML-% IV SOLN
2.0000 g | INTRAVENOUS | Status: DC
Start: 2021-09-15 — End: 2021-09-15

## 2021-09-15 MED ORDER — HYDROMORPHONE HCL 1 MG/ML IJ SOLN
0.2500 mg | INTRAMUSCULAR | Status: DC | PRN
  Administered 2021-09-15: 13:00:00 0.5 mg via INTRAVENOUS

## 2021-09-15 MED ORDER — SODIUM CHLORIDE 0.9% FLUSH: 3.0000 mL | INTRAVENOUS | Status: DC | PRN

## 2021-09-15 MED ORDER — SENNOSIDES-DOCUSATE SODIUM 8.6-50 MG PO TABS
2.0000 | ORAL_TABLET | ORAL | Status: DC
  Administered 2021-09-16 – 2021-09-17 (×2): 2 via ORAL
  Filled 2021-09-15 (×2): qty 2

## 2021-09-15 MED ORDER — PHENYLEPHRINE HCL (PRESSORS) 10 MG/ML IV SOLN
INTRAVENOUS | Status: DC | PRN
  Administered 2021-09-15: 12:00:00 80 ug via INTRAVENOUS

## 2021-09-15 MED ORDER — ACETAMINOPHEN 10 MG/ML IV SOLN: 1000.0000 mg | Freq: Once | INTRAVENOUS | Status: DC | PRN

## 2021-09-15 MED ORDER — LACTATED RINGERS IV SOLN
INTRAVENOUS | Status: DC
Start: 2021-09-15 — End: 2021-09-15

## 2021-09-15 MED ORDER — NALOXONE HCL 4 MG/10ML IJ SOLN
1.0000 ug/kg/h | INTRAVENOUS | Status: DC | PRN
  Filled 2021-09-15: qty 5

## 2021-09-15 MED ORDER — FENTANYL CITRATE (PF) 100 MCG/2ML IJ SOLN
INTRAMUSCULAR | Status: DC | PRN
  Administered 2021-09-15: 11:00:00 15 ug via INTRATHECAL

## 2021-09-15 MED ORDER — PROCHLORPERAZINE EDISYLATE 10 MG/2ML IJ SOLN
10.0000 mg | Freq: Once | INTRAMUSCULAR | Status: DC | PRN
  Filled 2021-09-15: qty 2

## 2021-09-15 MED ORDER — MORPHINE SULFATE (PF) 0.5 MG/ML IJ SOLN
INTRAMUSCULAR | Status: AC
  Filled 2021-09-15: qty 10

## 2021-09-15 MED ORDER — METOCLOPRAMIDE HCL 5 MG/ML IJ SOLN
INTRAMUSCULAR | Status: AC
  Filled 2021-09-15: qty 2

## 2021-09-15 MED ORDER — OXYTOCIN-SODIUM CHLORIDE 30-0.9 UT/500ML-% IV SOLN
INTRAVENOUS | Status: DC | PRN
  Administered 2021-09-15: 11:00:00 400 mL via INTRAVENOUS

## 2021-09-15 MED ORDER — OXYCODONE HCL 5 MG PO TABS: 5.0000 mg | ORAL_TABLET | Freq: Once | ORAL | Status: DC | PRN

## 2021-09-15 MED ORDER — SCOPOLAMINE 1 MG/3DAYS TD PT72
1.0000 | MEDICATED_PATCH | Freq: Once | TRANSDERMAL | Status: DC
  Administered 2021-09-15: 14:00:00 1.5 mg via TRANSDERMAL
  Filled 2021-09-15: qty 1

## 2021-09-15 MED ORDER — CEFAZOLIN SODIUM-DEXTROSE 2-4 GM/100ML-% IV SOLN
INTRAVENOUS | Status: AC
  Filled 2021-09-15: qty 100

## 2021-09-15 MED ORDER — ONDANSETRON HCL 4 MG/2ML IJ SOLN: 4.0000 mg | Freq: Three times a day (TID) | INTRAMUSCULAR | Status: DC | PRN

## 2021-09-15 MED ORDER — BUPIVACAINE HCL (PF) 0.25 % IJ SOLN
INTRAMUSCULAR | Status: AC
  Filled 2021-09-15: qty 20

## 2021-09-15 MED ORDER — ONDANSETRON HCL 4 MG/2ML IJ SOLN
INTRAMUSCULAR | Status: AC
  Filled 2021-09-15: qty 2

## 2021-09-15 MED ORDER — FENTANYL CITRATE (PF) 100 MCG/2ML IJ SOLN
INTRAMUSCULAR | Status: AC
  Filled 2021-09-15: qty 2

## 2021-09-15 MED ORDER — PHENYLEPHRINE HCL-NACL 20-0.9 MG/250ML-% IV SOLN
INTRAVENOUS | Status: AC
  Filled 2021-09-15: qty 250

## 2021-09-15 MED ORDER — DEXAMETHASONE SODIUM PHOSPHATE 4 MG/ML IJ SOLN
INTRAMUSCULAR | Status: DC | PRN
  Administered 2021-09-15: 12:00:00 8 mg via INTRAVENOUS

## 2021-09-15 MED ORDER — KETOROLAC TROMETHAMINE 30 MG/ML IJ SOLN: 30.0000 mg | Freq: Four times a day (QID) | INTRAMUSCULAR | Status: AC | PRN

## 2021-09-15 MED ORDER — LACTATED RINGERS IV SOLN: INTRAVENOUS | Status: DC

## 2021-09-15 MED ORDER — COCONUT OIL OIL: 1.0000 "application " | TOPICAL_OIL | Status: DC | PRN

## 2021-09-15 MED ORDER — SIMETHICONE 80 MG PO CHEW
80.0000 mg | CHEWABLE_TABLET | Freq: Three times a day (TID) | ORAL | Status: DC
  Administered 2021-09-15 – 2021-09-17 (×7): 80 mg via ORAL
  Filled 2021-09-15 (×7): qty 1

## 2021-09-15 MED ORDER — POVIDONE-IODINE 10 % EX SWAB
2.0000 "application " | Freq: Once | CUTANEOUS | Status: AC
  Administered 2021-09-15: 2 via TOPICAL

## 2021-09-15 MED ORDER — IBUPROFEN 600 MG PO TABS
600.0000 mg | ORAL_TABLET | Freq: Four times a day (QID) | ORAL | Status: DC
  Administered 2021-09-16 – 2021-09-17 (×5): 600 mg via ORAL
  Filled 2021-09-15 (×5): qty 1

## 2021-09-15 MED ORDER — HYDROMORPHONE HCL 1 MG/ML IJ SOLN
INTRAMUSCULAR | Status: AC
  Filled 2021-09-15: qty 0.5

## 2021-09-15 MED ORDER — CEFAZOLIN SODIUM-DEXTROSE 2-3 GM-%(50ML) IV SOLR
INTRAVENOUS | Status: DC | PRN
  Administered 2021-09-15: 11:00:00 2 g via INTRAVENOUS

## 2021-09-15 MED ORDER — ZOLPIDEM TARTRATE 5 MG PO TABS: 5.0000 mg | ORAL_TABLET | Freq: Every evening | ORAL | Status: DC | PRN

## 2021-09-15 MED ORDER — KETOROLAC TROMETHAMINE 30 MG/ML IJ SOLN
30.0000 mg | Freq: Four times a day (QID) | INTRAMUSCULAR | Status: AC
  Administered 2021-09-15 – 2021-09-16 (×3): 30 mg via INTRAVENOUS
  Filled 2021-09-15 (×4): qty 1

## 2021-09-15 MED ORDER — MORPHINE SULFATE (PF) 0.5 MG/ML IJ SOLN
INTRAMUSCULAR | Status: DC | PRN
Start: 2021-09-15 — End: 2021-09-15
  Administered 2021-09-15: 11:00:00 150 ug via INTRATHECAL

## 2021-09-15 MED ORDER — OXYTOCIN-SODIUM CHLORIDE 30-0.9 UT/500ML-% IV SOLN: 2.5000 [IU]/h | INTRAVENOUS | Status: AC

## 2021-09-15 MED ORDER — ACETAMINOPHEN 500 MG PO TABS
1000.0000 mg | ORAL_TABLET | Freq: Four times a day (QID) | ORAL | Status: DC
  Administered 2021-09-15 – 2021-09-17 (×8): 1000 mg via ORAL
  Filled 2021-09-15 (×9): qty 2

## 2021-09-15 MED ORDER — DEXAMETHASONE SODIUM PHOSPHATE 4 MG/ML IJ SOLN
INTRAMUSCULAR | Status: AC
  Filled 2021-09-15: qty 2

## 2021-09-15 MED ORDER — KETOROLAC TROMETHAMINE 30 MG/ML IJ SOLN
30.0000 mg | Freq: Four times a day (QID) | INTRAMUSCULAR | Status: AC | PRN
  Administered 2021-09-15: 14:00:00 30 mg via INTRAVENOUS

## 2021-09-15 MED ORDER — DIPHENHYDRAMINE HCL 25 MG PO CAPS: 25.0000 mg | ORAL_CAPSULE | Freq: Four times a day (QID) | ORAL | Status: DC | PRN

## 2021-09-15 MED ORDER — BUPIVACAINE IN DEXTROSE 0.75-8.25 % IT SOLN
INTRATHECAL | Status: DC | PRN
  Administered 2021-09-15: 11:00:00 1.6 mL via INTRATHECAL

## 2021-09-15 MED ORDER — ACETAMINOPHEN 10 MG/ML IV SOLN
INTRAVENOUS | Status: DC | PRN
Start: 2021-09-15 — End: 2021-09-15
  Administered 2021-09-15: 12:00:00 1000 mg via INTRAVENOUS

## 2021-09-15 SURGICAL SUPPLY — 33 items
BENZOIN TINCTURE PRP APPL 2/3 (GAUZE/BANDAGES/DRESSINGS) ×1 IMPLANT
CHLORAPREP W/TINT 26ML (MISCELLANEOUS) ×3 IMPLANT
CLAMP CORD UMBIL (MISCELLANEOUS) ×1 IMPLANT
CLOSURE STERI STRIP 1/2 X4 (GAUZE/BANDAGES/DRESSINGS) ×1 IMPLANT
CLOTH BEACON ORANGE TIMEOUT ST (SAFETY) ×2 IMPLANT
DRSG OPSITE POSTOP 4X10 (GAUZE/BANDAGES/DRESSINGS) ×2 IMPLANT
ELECT REM PT RETURN 9FT ADLT (ELECTROSURGICAL) ×2
ELECTRODE REM PT RTRN 9FT ADLT (ELECTROSURGICAL) ×1 IMPLANT
EXTRACTOR VACUUM M CUP 4 TUBE (SUCTIONS) IMPLANT
GLOVE SURG ENC MOIS LTX SZ6.5 (GLOVE) ×2 IMPLANT
GLOVE SURG UNDER POLY LF SZ7 (GLOVE) ×6 IMPLANT
GOWN STRL REUS W/TWL LRG LVL3 (GOWN DISPOSABLE) ×4 IMPLANT
KIT ABG SYR 3ML LUER SLIP (SYRINGE) IMPLANT
NDL HYPO 25X5/8 SAFETYGLIDE (NEEDLE) IMPLANT
NEEDLE HYPO 22GX1.5 SAFETY (NEEDLE) IMPLANT
NEEDLE HYPO 25X5/8 SAFETYGLIDE (NEEDLE) IMPLANT
NS IRRIG 1000ML POUR BTL (IV SOLUTION) ×2 IMPLANT
PACK C SECTION WH (CUSTOM PROCEDURE TRAY) ×2 IMPLANT
PAD OB MATERNITY 4.3X12.25 (PERSONAL CARE ITEMS) ×2 IMPLANT
PENCIL SMOKE EVAC W/HOLSTER (ELECTROSURGICAL) ×2 IMPLANT
STRIP CLOSURE SKIN 1/2X4 (GAUZE/BANDAGES/DRESSINGS) IMPLANT
SUT MON AB 4-0 PS1 27 (SUTURE) ×2 IMPLANT
SUT PLAIN 0 NONE (SUTURE) IMPLANT
SUT PLAIN 2 0 XLH (SUTURE) IMPLANT
SUT VIC AB 0 CT1 36 (SUTURE) ×4 IMPLANT
SUT VIC AB 0 CTX 36 (SUTURE) ×2
SUT VIC AB 0 CTX36XBRD ANBCTRL (SUTURE) ×2 IMPLANT
SUT VIC AB 2-0 CT1 27 (SUTURE) ×1
SUT VIC AB 2-0 CT1 TAPERPNT 27 (SUTURE) ×1 IMPLANT
SYR CONTROL 10ML LL (SYRINGE) IMPLANT
TOWEL OR 17X24 6PK STRL BLUE (TOWEL DISPOSABLE) ×2 IMPLANT
TRAY FOLEY W/BAG SLVR 14FR LF (SET/KITS/TRAYS/PACK) IMPLANT
WATER STERILE IRR 1000ML POUR (IV SOLUTION) ×2 IMPLANT

## 2021-09-15 NOTE — Anesthesia Postprocedure Evaluation (Signed)
Anesthesia Post Note ? ?Patient: Jackie [name] ? ?Procedure(s) Performed: Repeat CESAREAN SECTION ? ?  ? ?Patient location during evaluation: PACU ?Anesthesia Type: Spinal ?Level of consciousness: oriented and awake and alert ?Pain management: pain level controlled ?Vital Signs Assessment: post-procedure vital signs reviewed and stable ?Respiratory status: spontaneous breathing, respiratory function stable and nonlabored ventilation ?Cardiovascular status: blood pressure returned to baseline and stable ?Postop Assessment: no headache, no backache, no apparent nausea or vomiting and spinal receding ?Anesthetic complications: no ? ? ?No notable events documented. ? ?Last Vitals:  ?Vitals:  ? 09/15/21 1334 09/15/21 1427  ?BP: 97/64 98/67  ?Pulse: (!) 58 62  ?Resp: 18 17  ?Temp: (!) 35.8 ?C (!) 36.4 ?C  ?SpO2: 100% 97%  ?  ?Last Pain:  ?Vitals:  ? 09/15/21 1427  ?TempSrc: Oral  ?PainSc:   ? ?Pain Goal: Patients Stated Pain Goal: 2 (09/15/21 1323) ? ?  ?  ?  ?  ?  ?  ?  ? ?[name] ? ? ? ? ?

## 2021-09-15 NOTE — Anesthesia Procedure Notes (Signed)
Spinal ° °Patient location during procedure: OR °Reason for block: surgical anesthesia °Staffing °Performed: anesthesiologist  °Anesthesiologist: Jakira Mcfadden E, MD °Preanesthetic Checklist °Completed: patient identified, IV checked, risks and benefits discussed, surgical consent, monitors and equipment checked, pre-op evaluation and timeout performed °Spinal Block °Patient position: sitting °Prep: DuraPrep and site prepped and draped °Patient monitoring: continuous pulse ox, blood pressure and heart rate °Approach: midline °Location: L3-4 °Injection technique: single-shot °Needle °Needle type: Pencan  °Needle gauge: 24 G °Needle length: 10 cm °Assessment °Events: CSF return °Additional Notes °Functioning IV was confirmed and monitors were applied. Sterile prep and drape, including hand hygiene and sterile gloves were used. The patient was positioned and the spine was prepped. The skin was anesthetized with lidocaine.  Free flow of clear CSF was obtained prior to injecting local anesthetic into the CSF. The needle was carefully withdrawn. The patient tolerated the procedure well.  ° ° ° °

## 2021-09-15 NOTE — Anesthesia Preprocedure Evaluation (Signed)
Anesthesia Evaluation  ?Patient identified by MRN, date of birth, ID band ?Patient awake ? ? ? ?Reviewed: ?Allergy & Precautions, H&P , NPO status , Patient's Chart, lab work & pertinent test results ? ?History of Anesthesia Complications ?Negative for: history of anesthetic complications ? ?Airway ?Mallampati: II ? ?TM Distance: >3 FB ? ? ? ? Dental ?  ?Pulmonary ?neg pulmonary ROS,  ?  ?Pulmonary exam normal ? ? ? ? ? ? ? Cardiovascular ?negative cardio ROS ? ? ?Rhythm:regular Rate:Normal ? ? ?  ?Neuro/Psych ?negative neurological ROS ? negative psych ROS  ? GI/Hepatic ?negative GI ROS, Neg liver ROS,   ?Endo/Other  ?diabetes, Gestational ? Renal/GU ?negative Renal ROS  ?negative genitourinary ?  ?Musculoskeletal ? ? Abdominal ?  ?Peds ? Hematology ?negative hematology ROS ?(+)   ?     Component                Value               Date/Time            ?     HGB                      13.0                09/13/2021 0950      ?     HGB                      12.9                03/06/2019 0921      ?     HCT                      38.1                09/13/2021 0950      ?     HCT                      37.6                03/06/2019 0921      ?     PLT                      218                 09/13/2021 0950      ?     PLT                      266                 03/06/2019 0921        ?Anesthesia Other Findings ? ? Reproductive/Obstetrics ?(+) Pregnancy ?G2P1001 at [redacted]w[redacted]d ? ?  ? ? ? ? ? ? ? ? ? ? ? ? ? ?  ?  ? ? ? ? ? ? ? ? ?Anesthesia Physical ?Anesthesia Plan ? ?ASA: 2 ? ?Anesthesia Plan: Spinal  ? ?Post-op Pain Management:   ? ?Induction:  ? ?PONV Risk Score and Plan: Ondansetron and Treatment may vary due to age or medical condition ? ?Airway Management Planned:  ? ?Additional Equipment:  ? ?Intra-op Plan:  ? ?Post-operative Plan:  ? ?Informed Consent: I have reviewed the patients History and Physical, chart, labs and discussed the  procedure including the risks, benefits and  alternatives for the proposed anesthesia with the patient or authorized representative who has indicated his/her understanding and acceptance.  ? ? ? ? ? ?Plan Discussed with: Anesthesiologist ? ?Anesthesia Plan Comments:   ? ? ? ? ? ? ?Anesthesia Quick Evaluation ? ?

## 2021-09-15 NOTE — Op Note (Signed)
09/15/2021 ? ?11:55 AM ? ?PATIENT:  Jackie [name]  36 y.o. female ? ?PRE-OPERATIVE DIAGNOSIS:  Previous Cesarean Section ? ?POST-OPERATIVE DIAGNOSIS:  Repeat Cesarean Section ? ?PROCEDURE:  Procedure(s) with comments: ?Repeat CESAREAN SECTION (N/A) - EDD: 09/18/21 ?Low-transverse cesarean section with 2 layer closure ? ?SURGEON:  Surgeon(s) and Role: ?   Aloha Gell, MD - Primary ? ?PHYSICIAN ASSISTANT: None ? ?ASSISTANTS: Surgical assistant ? ?ANESTHESIA:   spinal ? ?EBL: Per nursing notes ? ?BLOOD ADMINISTERED:none ? ?DRAINS: Urinary Catheter (Foley)  ? ?LOCAL MEDICATIONS USED:  NONE ? ?SPECIMEN: Placenta ? ?DISPOSITION OF SPECIMEN:  To labor and delivery for disposal ? ?COUNTS:  YES ? ?TOURNIQUET:  * No tourniquets in log * ? ?DICTATION: .Note written in EPIC ? ?PLAN OF CARE: Admit to inpatient  ? ?PATIENT DISPOSITION:  PACU - hemodynamically stable. ?  ?Delay start of Pharmacological VTE agent (>24hrs) due to surgical blood loss or risk of bleeding: yes ? ?Findings:  @BABYSEXEBC @ infant,  APGAR (1 MIN): 9   ?APGAR (5 MINS): 9   ?APGAR (10 MINS):   ?Normal uterus, tubes and ovaries, normal placenta. 3VC, clear amniotic fluid ? ?EBL: Per nursing notes cc ?Antibiotics:   2g Ancef ?Complications: none ? ?Indications: This is a 36 y.o. year-old, G2 P1 at [redacted]w[redacted]d admitted for elective repeat cesarean section. Risks benefits and alternatives of the procedure were discussed with the patient who agreed to proceed ? ?Procedure:  After informed consent was obtained the patient was taken to the operating room where spinal anesthesia was initiated.  She was prepped and draped in the normal sterile fashion in dorsal supine position with a leftward tilt.  A foley catheter was in place.  A Pfannenstiel skin incision was made 2 cm above the pubic symphysis in the midline with the scalpel.  Dissection was carried down with the Bovie cautery until the fascia was reached. The fascia was incised in the midline. The incision  was extended laterally with the Mayo scissors. The inferior aspect of the fascial incision was grasped with the Coker clamps, elevated up and the underlying rectus muscles were dissected off sharply. The superior aspect of the fascial incision was grasped with the Coker clamps elevated up and the underlying rectus muscles were dissected off sharply.  The peritoneum was . The peritoneal incision was extended superiorly and inferiorly with good visualization of the bladder. The bladder blade was inserted and palpation was done to assess the fetal position and the location of the uterine vessels. The lower segment of the uterus was incised sharply with the scalpel and extended  bluntly in the cephalo-caudal fashion. The infant was grasped, brought to the incision,  rotated and the infant was delivered with fundal pressure. The nose and mouth were bulb suctioned. The cord was clamped and cut after 1 minute delay. The infant was handed off to the waiting pediatrician. The placenta was expressed. The uterus was exteriorized. The uterus was cleared of all clots and debris. The uterine incision was repaired with 0 Vicryl in a running locked fashion.  A second layer of the same suture was used in an imbricating fashion to obtain excellent hemostasis.  The uterus was then returned to the abdomen, the gutters were cleared of all clots and debris. The uterine incision was reinspected and found to be hemostatic. The peritoneum was grasped and closed with 2-0 Vicryl in a running fashion. The cut muscle edges and the underside of the fascia were inspected and found to be hemostatic.  The fascia was closed with 0 Vicryl in a single layer . The subcutaneous tissue was irrigated. Scarpa's layer was closed with a 2-0 plain gut suture. The skin was closed with a 4-0 Monocryl in a single layer. The patient tolerated the procedure well. Sponge lap and needle counts were correct x3 and patient was taken to the recovery room in a stable  condition. ? ?[name] ?09/15/2021 11:58 AM  ? ?

## 2021-09-15 NOTE — Lactation Note (Signed)
This note was copied from a baby's chart. ?Lactation Consultation Note ? ?Patient Name: [name] ?Today's Date: 09/15/2021 ?Reason for consult: Initial assessment;Mother's request;Term;Maternal endocrine disorder;Breastfeeding assistance ?Age:34 hours ? ?Infant latched with compression stripe. LC assisted converting latch to get deeper hold in cradle with support. Dimpling of cheeks resolved with changes made.  ? ?Plan 1 to feed based on cues 8-12x 24hr period. Mom to offer breasts and look for signs of milk transfer.  ?2. Mom to offer EBM via spoon 5-7 ml if unable to get infant to latch.  ?3. I and O sheet reviewed.  ?All questions answered at the end of the visit.  ? ?Maternal Data ?Has patient been taught Hand Expression?: Yes ?Does the patient have breastfeeding experience prior to this delivery?: Yes ?How long did the patient breastfeed?: 6 months with supplementation BM supply impacted by PPH. ? ?Feeding ?Mother's Current Feeding Choice: Breast Milk ? ?LATCH Score ?Latch: Grasps breast easily, tongue down, lips flanged, rhythmical sucking. ? ?Audible Swallowing: Spontaneous and intermittent ? ?Type of Nipple: Everted at rest and after stimulation ? ?Comfort (Breast/Nipple): Soft / non-tender ? ?Hold (Positioning): Assistance needed to correctly position infant at breast and maintain latch. ? ?LATCH Score: 9 ? ? ?Lactation Tools Discussed/Used ?  ? ?Interventions ?Interventions: Breast feeding basics reviewed;Assisted with latch;Skin to skin;Breast massage;Hand express;Breast compression;Adjust position;Support pillows;Position options;Expressed milk;Education;LC Psychologist, educational;Infant Driven Feeding Algorithm education ? ?Discharge ?  ? ?Consult Status ?Consult Status: Follow-up ?Date: 09/16/21 ?Follow-up type: In-patient ? ? ? ?Lochlyn Zullo  Nicholson-Springer ?09/15/2021, 5:44 PM ? ? ? ?

## 2021-09-15 NOTE — Brief Op Note (Signed)
09/15/2021 ? ?11:55 AM ? ?PATIENT:  Jackie [name]  36 y.o. female ? ?PRE-OPERATIVE DIAGNOSIS:  Previous Cesarean Section ? ?POST-OPERATIVE DIAGNOSIS:  Repeat Cesarean Section ? ?PROCEDURE:  Procedure(s) with comments: ?Repeat CESAREAN SECTION (N/A) - EDD: 09/18/21 ?Low-transverse cesarean section with 2 layer closure ? ?SURGEON:  Surgeon(s) and Role: ?   Aloha Gell, MD - Primary ? ?PHYSICIAN ASSISTANT: None ? ?ASSISTANTS: Surgical assistant ? ?ANESTHESIA:   spinal ? ?EBL: Per nursing notes ? ?BLOOD ADMINISTERED:none ? ?DRAINS: Urinary Catheter (Foley)  ? ?LOCAL MEDICATIONS USED:  NONE ? ?SPECIMEN: Placenta ? ?DISPOSITION OF SPECIMEN:   To labor and delivery for disposal ? ?COUNTS:  YES ? ?TOURNIQUET:  * No tourniquets in log * ? ?DICTATION: .Note written in EPIC ? ?PLAN OF CARE: Admit to inpatient  ? ?PATIENT DISPOSITION:  PACU - hemodynamically stable. ?  ?Delay start of Pharmacological VTE agent (>24hrs) due to surgical blood loss or risk of bleeding: yes ? ?

## 2021-09-15 NOTE — Transfer of Care (Signed)
Immediate Anesthesia Transfer of Care Note ? ?Patient: Jackie [name] ? ?Procedure(s) Performed: Repeat CESAREAN SECTION ? ?Patient Location: PACU ? ?Anesthesia Type:Spinal ? ?Level of Consciousness: awake, alert  and oriented ? ?Airway & Oxygen Therapy: Patient Spontanous Breathing ? ?Post-op Assessment: Report given to RN and Post -op Vital signs reviewed and stable ? ?Post vital signs: Reviewed and stable ? ?Last Vitals:  ?Vitals Value Taken Time  ?BP 100/61 09/15/21 1215  ?Temp    ?Pulse 59 09/15/21 1218  ?Resp    ?SpO2 96 % 09/15/21 1218  ?Vitals shown include unvalidated device data. ? ?Last Pain:  ?Vitals:  ? 09/15/21 0824  ?TempSrc: Oral  ?   ? ?  ? ?Complications: No notable events documented. ?

## 2021-09-16 LAB — CBC
HCT: 30.8 % — ABNORMAL LOW (ref 36.0–46.0)
Hemoglobin: 10.4 g/dL — ABNORMAL LOW (ref 12.0–15.0)
MCH: 31.7 pg (ref 26.0–34.0)
MCHC: 33.8 g/dL (ref 30.0–36.0)
MCV: 93.9 fL (ref 80.0–100.0)
Platelets: 189 10*3/uL (ref 150–400)
RBC: 3.28 MIL/uL — ABNORMAL LOW (ref 3.87–5.11)
RDW: 13.4 % (ref 11.5–15.5)
WBC: 15.5 10*3/uL — ABNORMAL HIGH (ref 4.0–10.5)
nRBC: 0 % (ref 0.0–0.2)

## 2021-09-16 LAB — BIRTH TISSUE RECOVERY COLLECTION (PLACENTA DONATION)

## 2021-09-16 MED ORDER — OXYCODONE HCL 5 MG PO TABS
5.0000 mg | ORAL_TABLET | ORAL | Status: DC | PRN
  Administered 2021-09-16 – 2021-09-17 (×2): 5 mg via ORAL
  Filled 2021-09-16 (×2): qty 1

## 2021-09-16 NOTE — Progress Notes (Signed)
Subjective: ?Postpartum Day 1 scheduled Repeat LTCS G2P2  ? ?Patient reports feeling well ambulating, voiding. Lochia minimal, pain controlled and eating well, ?Breast feeding .   ? ?Objective: ?Vital signs in last 24 hours: ?Temp:  [96.4 ?F (35.8 ?C)-98.2 ?F (36.8 ?C)] 97.7 ?F (36.5 ?C) (03/10 0537) ?Pulse Rate:  [58-73] 73 (03/10 0537) ?Resp:  [13-20] 18 (03/10 0537) ?BP: (87-108)/(56-73) 104/66 (03/10 0537) ?SpO2:  [96 %-100 %] 96 % (03/10 0537) ? ?Physical Exam:  ?General: alert and cooperative ?Lochia: appropriate ?Lungs CTA bil  ?CV RRR  ?Uterine Fundus: firm ?Incision: healing well, no significant drainage ?DVT Evaluation: No evidence of DVT seen on physical exam. ? ?CBC Latest Ref Rng & Units 09/16/2021 09/13/2021   ?WBC 4.0 - 10.5 K/uL 15.5(H) 8.3   ?Hemoglobin 12.0 - 15.0 g/dL 10.4(L) 13.0   ?Hematocrit 36.0 - 46.0 % 30.8(L) 38.1   ?Platelets 150 - 400 K/uL 189 218   ?  ?A+ ?Rub Imm  ? ?Assessment/Plan: ?POD #1, RC/s stable. Girl 9'9" ?POst op and PP care -routine, warning ss discussed  Abdominal binder to support  ?No depression / anxiety hx ?LC consult  ?Possible Dc tomorrow  ? ?Alfredia Ferguson Sanja Elizardo ?09/16/2021, 12:56 PM ? ? ?

## 2021-09-17 MED ORDER — IBUPROFEN 600 MG PO TABS: 600.0000 mg | ORAL_TABLET | Freq: Four times a day (QID) | ORAL | 0 refills | Status: AC

## 2021-09-17 MED ORDER — OXYCODONE HCL 5 MG PO TABS: 5.0000 mg | ORAL_TABLET | ORAL | 0 refills | Status: AC | PRN

## 2021-09-17 MED ORDER — ACETAMINOPHEN 500 MG PO TABS: 1000.0000 mg | ORAL_TABLET | Freq: Four times a day (QID) | ORAL | 0 refills | Status: AC

## 2021-09-17 NOTE — Lactation Note (Signed)
This note was copied from a baby's chart. ?Lactation Consultation Note ? ?Patient Name: Girl Sonaya Resseguie ?Today's Date: 09/17/2021 ?Reason for consult: Follow-up assessment;Term;Infant weight loss ?Age:34 hours ? ?Mom feeding baby when [facility] entered.  Mom had fed on both sides and LC noticed baby continually sucking without any swallows heard.  Mom states she was not sure if she was hearing swallows.  Mom was concerned about Wt. Loss.  11% See Ped note. ?2 stools charted mom has seen three stools and 6 voids.   ?Mom feels comfortable with latching.  ? ?LC discussed following up with [place] [facility] [place], Estanislado Pandy whom mom is familiar with from previous child.   ? ?Baby cued so mom latched but baby was pulling on and off. ?When baby came off the breast, she cued to feed again.  She had difficulty latching and pulled back repeatedly with nipple in her mouth.  Mom hasn't seen her do this before. ?Because infant has been feeding prior to visit, Bryant suggested mom call with next feeding so LC can assess feeding. ? ?Baby slept STS on mom's chest.  ? ? ? ?Maternal Data ?Has patient been taught Hand Expression?: Yes ? ?Feeding ?Mother's Current Feeding Choice: Breast Milk ? ?LATCH Score ?Latch: Repeated attempts needed to sustain latch, nipple held in mouth throughout feeding, stimulation needed to elicit sucking reflex. (infant pulling back at nipple and losing latch, once latched, rhythmic sucking but only two swallows heard when compressions used.) ? ?Audible Swallowing: A few with stimulation (two swallows heard) ? ?Type of Nipple: Everted at rest and after stimulation ? ?Comfort (Breast/Nipple): Soft / non-tender ? ?Hold (Positioning): Assistance needed to correctly position infant at breast and maintain latch. ? ?LATCH Score: 7 ? ? ?Lactation Tools Discussed/Used ?  ? ?Interventions ?Interventions: Breast feeding basics reviewed;Education ? ?Discharge ?  ? ?Consult Status ?Consult Status: Follow-up ?Date:  09/17/21 ?Follow-up type: In-patient ? ? ? ?Ferne Coe Sherice Ijames ?09/17/2021, 9:25 AM ? ? ? ?

## 2021-09-17 NOTE — Lactation Note (Signed)
This note was copied from a baby's chart. ?Lactation Consultation Note ? ?Patient Name: Jackie [name] ?Today's Date: 09/17/2021 ?Reason for consult: Follow-up assessment;Infant weight loss;Mother's request ?Age:34 hours ? ?Mom called out for [facility] to observe feeding.   ?Wt. 3900 grams up from this morning. ? ?Baby transferred 22ml total during breastfeed. ? ?Only a couple of swallows heard.  Baby came off the breast crying with hands in her mouth. ? ?LC assisted with mom pumping to collect milk to feed back to infant.  Used personal spectra. ?21ml spoon fed while mom observed.   ? ?LC recommended supplementing due to infant crying, hands to mouth, then falling asleep at the breast. ? ?Mom prefers powdered formula over DM or formula here.   ?Supplementation guidelines given.  ? ?RN assisting mom with formula prep. ? ?LC encouraged ?Breastfeeding baby and listening for swallows.  Pumping after BF and feeding back collected milk.  Mom also may supplement with formula after breastfeeding.   ? ?LC explained this BF, pumping, husband bottle feeding is not a long term plan OP follow up needed.  Mom goes to ped on Monday.  LC OP encouraged with [facility] to ensure good latching and milk transfer. ? ? ? ? ? ? ?Maternal Data ?Has patient been taught Hand Expression?: Yes ? ?Feeding ?Mother's Current Feeding Choice: Breast Milk ? ?LATCH Score ?Latch: Repeated attempts needed to sustain latch, nipple held in mouth throughout feeding, stimulation needed to elicit sucking reflex. ? ?Audible Swallowing: A few with stimulation (a couple of swallows heard) ? ?Type of Nipple: Everted at rest and after stimulation ? ?Comfort (Breast/Nipple): Soft / non-tender ? ?Hold (Positioning): Assistance needed to correctly position infant at breast and maintain latch. ? ?LATCH Score: 7 ? ? ?Lactation Tools Discussed/Used ?Tools: Pump ?Breast pump type: Double-Electric Breast Pump ?Reason for Pumping: collect milk stimulate supply ?Pumping  frequency: encouraged to pump after BF ?Pumped volume: 8 mL ? ?Interventions ?Interventions: Position options;Expressed milk;Support pillows;Breast compression;Assisted with latch;Skin to skin;Hand express ? ?Discharge ?Discharge Education: Engorgement and breast care;Warning signs for feeding baby;Outpatient recommendation ([place] [facility]) ? ?Consult Status ?Consult Status: Complete ?Date: 09/17/21 ?Follow-up type: In-patient ? ? ? ?Jackie [name] ?09/17/2021, 12:02 PM ? ? ? ?

## 2021-09-17 NOTE — Discharge Summary (Signed)
? ?  Postpartum Discharge Summary ? ?Date of Service updated 09/17/21 ? ?   ?Patient Name: Jackie [name] ?DOB: [birth year] ?MRN: [id number] ? ?Date of admission: 09/15/2021 ?Delivery date:09/15/2021  ?Delivering provider: Aloha Gell  ?Date of discharge: 09/17/2021 ? ?Admitting diagnosis: S/P cesarean section [Z98.891] ?Previous cesarean section [Z98.891] ?Intrauterine pregnancy: [redacted]w[redacted]d    ?Secondary diagnosis:  Principal Problem: ?  S/P cesarean section ?Active Problems: ?  Previous cesarean section ? ?Additional problems: n/a    ?Discharge diagnosis: Term Pregnancy Delivered                                              ?Post partum procedures: N/A ?Augmentation: N/A ?Complications: None ? ?Hospital course: Sceduled C/S   36y.o. yo G2P2002 at 34w4das admitted to the hospital 09/15/2021 for scheduled cesarean section with the following indication:Elective Repeat.Delivery details are as follows:  ?Membrane Rupture Time/Date: 11:16 AM ,09/15/2021   ?Delivery Method:C-Section, Low Transverse  ?Details of operation can be found in separate operative note.  Patient had an uncomplicated postpartum course.  She is ambulating, tolerating a regular diet, passing flatus, and urinating well. Patient is discharged home in stable condition on  09/17/21 ?       ?Newborn Data: ?Birth date:09/15/2021  ?Birth time:11:16 AM  ?Gender:Female  ?Living status:Living  ?Apgars:9 ,9  ?Weight:4350 g    ? ?Magnesium Sulfate received: No ?BMZ received: No ?Rhophylac:N/A ?MMR:N/A ?T-DaP:Given prenatally ? ?Physical exam  ?Vitals:  ? 09/16/21 1349 09/16/21 2022 09/16/21 2130 09/17/21 0603  ?BP: 106/60 (!) 87/60 107/71 105/69  ?Pulse: 70 74  75  ?Resp: 16 16  18   ?Temp: 98 ?F (36.7 ?C) 99.3 ?F (37.4 ?C)  (!) 97.5 ?F (36.4 ?C)  ?TempSrc: Oral Oral  Oral  ?SpO2: 98% 99%  100%  ?Weight:      ?Height:      ? ?General: alert and no distress ?Lochia: appropriate ?Uterine Fundus: firm ?Incision: Healing well with no significant drainage ?DVT Evaluation: No  evidence of DVT seen on physical exam. ?Labs: ?Lab Results  ?Component Value Date  ? WBC 15.5 (H) 09/16/2021  ? HGB 10.4 (L) 09/16/2021  ? HCT 30.8 (L) 09/16/2021  ? MCV 93.9 09/16/2021  ? PLT 189 09/16/2021  ? ?CMP Latest Ref Rng & Units 03/21/2019  ?Glucose 65 - 99 mg/dL 87  ?BUN 6 - 20 mg/dL 4(L)  ?Creatinine 0.57 - 1.00 mg/dL 0.64  ?Sodium 134 - 144 mmol/L 136  ?Potassium 3.5 - 5.2 mmol/L 3.9  ?Chloride 96 - 106 mmol/L 105  ?CO2 20 - 29 mmol/L 17(L)  ?Calcium 8.7 - 10.2 mg/dL 8.6(L)  ? ?Edinburgh Score: ?Edinburgh Postnatal Depression Scale Screening Tool 09/17/2021  ?I have been able to laugh and see the funny side of things. 0  ?I have looked forward with enjoyment to things. 0  ?I have blamed myself unnecessarily when things went wrong. 1  ?I have been anxious or worried for no good reason. 0  ?I have felt scared or panicky for no good reason. 0  ?Things have been getting on top of me. 0  ?I have been so unhappy that I have had difficulty sleeping. 0  ?I have felt sad or miserable. 0  ?I have been so unhappy that I have been crying. 0  ?The thought of harming myself has occurred to me. 0  ?EdFlavia Shipper  Postnatal Depression Scale Total 1  ? ? ? ? ?After visit meds:  ?Allergies as of 09/17/2021   ?No Known Allergies ?  ? ?  ?Medication List  ?  ? ?TAKE these medications   ? ?acetaminophen 500 MG tablet ?Commonly known as: TYLENOL ?Take 2 tablets (1,000 mg total) by mouth every 6 (six) hours. ?  ?Breast Pump Misc ?Dispense one breast pump for patient ?  ?calcium carbonate 1250 (500 Ca) MG chewable tablet ?Commonly known as: OS-CAL ?Chew 1-2 tablets by mouth 3 (three) times daily as needed for heartburn. ?  ?docusate sodium 100 MG capsule ?Commonly known as: COLACE ?Take 1 capsule (100 mg total) by mouth 2 (two) times daily. ?What changed:  ?when to take this ?reasons to take this ?  ?ibuprofen 600 MG tablet ?Commonly known as: ADVIL ?Take 1 tablet (600 mg total) by mouth every 6 (six) hours. ?  ?oxyCODONE 5 MG  immediate release tablet ?Commonly known as: Oxy IR/ROXICODONE ?Take 1 tablet (5 mg total) by mouth every 4 (four) hours as needed for moderate pain. ?  ?prenatal vitamin w/FE, FA 27-1 MG Tabs tablet ?Take 1 tablet by mouth daily before breakfast. ?What changed: when to take this ?  ? ?  ? ?  ?  ? ? ?  ?Discharge Care Instructions  ?(From admission, onward)  ?  ? ? ?  ? ?  Start     Ordered  ? 09/17/21 0000  Discharge wound care:       ?Comments: Remove Honeycomb dressing at 5-7 days post cesarean section date. Keep incision clean and dry.  ? 09/17/21 1459  ? ?  ?  ? ?  ? ? ? ?Discharge home in stable condition ?Infant Feeding: Breast ?Infant Disposition:home with mother ?Discharge instruction: per After Visit Summary and Postpartum booklet. ?Activity: Advance as tolerated. Pelvic rest for 6 weeks.  ?Diet: routine diet ?Anticipated Birth Control: Unsure ?Postpartum Appointment:6 weeks ?Additional Postpartum F/U:  N/a ?Future Appointments:No future appointments. ?Follow up Visit: Patient scheduled for 6 week postpartum visit with Dr. Pamala Hurry  ? ?  ? ?09/17/2021 ?Arlethia Basso A Birney Belshe, DO ? ? ?

## 2021-09-17 NOTE — Progress Notes (Signed)
Subjective: ?Postpartum Day Two: Cesarean Delivery ?Patient reports doing well. Pain manageable with pain medications. Ambulating without dizziness. Lochia is light. Spontaneously voiding and passing flatus. Tolerating regular diet, denies N/V. Denies CP, SOB, or HA ? ?Baby girl is doing well at bedside working on BF with LC ? ?Objective: ?Patient Vitals for the past 24 hrs: ? BP Temp Temp src Pulse Resp SpO2  ?09/17/21 0603 105/69 (!) 97.5 ?F (36.4 ?C) Oral 75 18 100 %  ?09/16/21 2130 107/71 -- -- -- -- --  ?09/16/21 2022 (!) 87/60 99.3 ?F (37.4 ?C) Oral 74 16 99 %  ?09/16/21 1349 106/60 98 ?F (36.7 ?C) Oral 70 16 98 %  ? ? ?Intake/Output Summary (Last 24 hours) at 09/17/2021 1318 ?Last data filed at 09/16/2021 1700 ?Gross per 24 hour  ?Intake 1026.31 ml  ?Output 600 ml  ?Net 426.31 ml  ? ? ? ?Physical Exam:  ?General: alert and no distress ?Lochia: appropriate ?Uterine Fundus: firm ?Incision: no significant drainage ?DVT Evaluation: No evidence of DVT seen on physical exam. ? ?Recent Labs  ?  09/16/21 ?2458  ?HGB 10.4*  ?HCT 30.8*  ? ? ?Assessment/Plan: ?Status post Cesarean section. Doing well postoperatively.  ?Continue current care. ?Clytie Shetley Spark [id number] POD#2 sp repeat c/s at [redacted]w[redacted]d ?1. PPC: cont current pain regimen, encourage ambulation, regular diet ?2. Acute on chronic blood loss anemia. POD1 HGB stable 10.4, light lochia, and asx. Initiate PO Fe at time of discharge ?3. RH POS ?4. Rubella Imm ?5. Patient clear for discharge home today if desired. Will monitor progress today and notify RN if requesting discharge home this evening versus tomorrow POD3 ? ?Lexii Walsh A Marlette Curvin ?09/17/2021, 1:17 PM  ? ?

## 2021-09-26 ENCOUNTER — Telehealth (HOSPITAL_COMMUNITY): Payer: Self-pay | Admitting: *Deleted

## 2021-09-26 NOTE — Telephone Encounter (Signed)
Phone voicemail message left to return nurse call. ? ?Odis Hollingshead, RN 09-26-2021 at 3:25pm ?

## 2021-10-19 ENCOUNTER — Encounter (HOSPITAL_COMMUNITY): Payer: Self-pay | Admitting: Obstetrics
